# Patient Record
Sex: Male | Born: 1935 | Race: White | Hispanic: No | Smoking: Never smoker
Health system: Southern US, Community
[De-identification: ages and names within clinical notes are randomized; demographics above are authoritative.]

## PROBLEM LIST (undated history)

## (undated) DIAGNOSIS — J069 Acute upper respiratory infection, unspecified: Secondary | ICD-10-CM

## (undated) DIAGNOSIS — K469 Unspecified abdominal hernia without obstruction or gangrene: Secondary | ICD-10-CM

## (undated) DIAGNOSIS — F418 Other specified anxiety disorders: Secondary | ICD-10-CM

## (undated) DIAGNOSIS — G20 Parkinson's disease: Secondary | ICD-10-CM

## (undated) HISTORY — DX: Parkinson's disease: G20

## (undated) HISTORY — DX: Other specified anxiety disorders: F41.8

## (undated) HISTORY — DX: Unspecified abdominal hernia without obstruction or gangrene: K46.9

---

## 2003-06-13 ENCOUNTER — Ambulatory Visit (HOSPITAL_COMMUNITY): Admission: RE | Admit: 2003-06-13 | Discharge: 2003-06-13 | Payer: Self-pay | Admitting: Gastroenterology

## 2004-07-08 ENCOUNTER — Ambulatory Visit (HOSPITAL_COMMUNITY): Admission: RE | Admit: 2004-07-08 | Discharge: 2004-07-08 | Payer: Self-pay | Admitting: Gastroenterology

## 2011-11-06 ENCOUNTER — Encounter (INDEPENDENT_AMBULATORY_CARE_PROVIDER_SITE_OTHER): Payer: Self-pay | Admitting: General Surgery

## 2011-11-06 ENCOUNTER — Ambulatory Visit (INDEPENDENT_AMBULATORY_CARE_PROVIDER_SITE_OTHER): Payer: 59 | Admitting: General Surgery

## 2011-11-06 VITALS — BP 152/78 | HR 68 | Temp 98.8°F | Ht 69.0 in | Wt 191.0 lb

## 2011-11-06 DIAGNOSIS — K4090 Unilateral inguinal hernia, without obstruction or gangrene, not specified as recurrent: Secondary | ICD-10-CM

## 2011-11-06 MED ORDER — TAMSULOSIN HCL 0.4 MG PO CAPS: 0.4000 mg | ORAL_CAPSULE | Freq: Every day | ORAL | Status: DC

## 2011-11-06 NOTE — Patient Instructions (Signed)
Pick up your flomax from the pharmacy  Start taking the flomax 2 days before surgery  Hernia Repair with Laparoscope A hernia occurs when an internal organ pushes out through a weak spot in the belly (abdominal) wall muscles. Hernias most commonly occur in the groin and around the navel. Hernias can also occur through a cut by the surgeon (incision) after an abdominal operation. A hernia may be caused by:  Lifting heavy objects.   Prolonged coughing.   Straining to move your bowels.  Hernias can often be pushed back into place (reduced). Most hernias tend to get worse over time. Problems occur when abdominal contents get stuck in the opening and the blood supply is blocked or impaired (incarcerated hernia). Because of these risks, you require surgery to repair the hernia. Your hernia will be repaired using a laparoscope. Laparoscopic surgery is a type of minimally invasive surgery. It does not involve making a typical surgical cut (incision) in the skin. A laparoscope is a telescope-like rod and lens system. It is usually connected to a video camera and a light source so your caregiver can clearly see the operative area. The instruments are inserted through  to  inch (5 mm or 10 mm) openings in the skin at specific locations. A working and viewing space is created by blowing a small amount of carbon dioxide gas into the abdominal cavity. The abdomen is essentially blown up like a balloon (insufflated). This elevates the abdominal wall above the internal organs like a dome. The carbon dioxide gas is common to the human body and can be absorbed by tissue and removed by the respiratory system. Once the repair is completed, the small incisions will be closed with either stitches (sutures) or staples (just like a paper stapler only this staple holds the skin together). LET YOUR CAREGIVERS KNOW ABOUT:  Allergies.   Medications taken including herbs, eye drops, over the counter medications, and creams.     Use of steroids (by mouth or creams).   Previous problems with anesthetics or Novocaine.   Possibility of pregnancy, if this applies.   History of blood clots (thrombophlebitis).   History of bleeding or blood problems.   Previous surgery.   Other health problems.  BEFORE THE PROCEDURE  Laparoscopy can be done either in a hospital or out-patient clinic. You may be given a mild sedative to help you relax before the procedure. Once in the operating room, you will be given a general anesthesia to make you sleep (unless you and your caregiver choose a different anesthetic).  AFTER THE PROCEDURE  After the procedure you will be watched in a recovery area. Depending on what type of hernia was repaired, you might be admitted to the hospital or you might go home the same day. With this procedure you may have less pain and scarring. This usually results in a quicker recovery and less risk of infection. HOME CARE INSTRUCTIONS   Bed rest is not required. You may continue your normal activities but avoid heavy lifting (more than 10 pounds) or straining.   Cough gently. If you are a smoker it is best to stop, as even the best hernia repair can break down with the continual strain of coughing.   Avoid driving until given the OK by your surgeon.   There are no dietary restrictions unless given otherwise.   TAKE ALL MEDICATIONS AS DIRECTED.   Only take over-the-counter or prescription medicines for pain, discomfort, or fever as directed by your caregiver.  SEEK MEDICAL CARE IF:   There is increasing abdominal pain or pain in your incisions.   There is more bleeding from incisions, other than minimal spotting.   You feel light headed or faint.   You develop an unexplained fever, chills, and/or an oral temperature above 102 F (38.9 C).   You have redness, swelling, or increasing pain in the wound.   Pus coming from wound.   A foul smell coming from the wound or dressings.  SEEK  IMMEDIATE MEDICAL CARE IF:   You develop a rash.   You have difficulty breathing.   You have any allergic problems.  MAKE SURE YOU:   Understand these instructions.   Will watch your condition.   Will get help right away if you are not doing well or get worse.  Document Released: 08/31/2005 Document Revised: 05/13/2011 Document Reviewed: 07/31/2009 [facility] Patient Information 2012 [place], [place].

## 2011-11-06 NOTE — Progress Notes (Signed)
Patient ID: Randy Davenport, male   DOB: 07/27/1931, 78 y.o.   MRN: [id number]  Chief Complaint  Patient presents with  . Pre-op Exam    eval RIH    HPI Randy Davenport is a 78 y.o. male.   HPI This 78-year-old Caucasian male comes in to discuss surgery for his known right inguinal hernia. The patient initially saw Dr. Newman in 2006 for right inguinal hernia. At that time the hernia was small. For the past several years the patient has been wearing a hernia belt. The patient states that the hernia belt is becoming ineffective. He states that when he stands for prolonged period of time he has discomfort in his right groin. He describes it as a mild pain in that area. It does not radiate. He denies any inguinal numbness or tingling. He denies any fevers, chills, nausea, vomiting, diarrhea, or constipation.  He does have Parkinson's disease. Past Medical History  Diagnosis Date  . Parkinson disease   . Hernia     right ing    History reviewed. No pertinent past surgical history.  History reviewed. No pertinent family history.  Social History History  Substance Use Topics  . Smoking status: Never Smoker   . Smokeless tobacco: Not on file  . Alcohol Use: No    No Known Allergies  Current Outpatient Prescriptions  Medication Sig Dispense Refill  . aspirin 81 MG tablet Take 81 mg by mouth daily.      . pramipexole (MIRAPEX) 0.5 MG tablet Take 0.5 mg by mouth 3 (three) times daily.      . Tamsulosin HCl (FLOMAX) 0.4 MG CAPS Take 1 capsule (0.4 mg total) by mouth daily.  10 capsule  0    Review of Systems Review of Systems  Constitutional: Negative for fever, chills, appetite change and unexpected weight change.  HENT: Negative for congestion and trouble swallowing.   Eyes: Negative for visual disturbance.  Respiratory: Negative for chest tightness and shortness of breath.   Cardiovascular: Negative for chest pain and leg swelling.       No PND, no orthopnea, no DOE    Gastrointestinal:       See HPI  Genitourinary: Positive for urgency and frequency. Negative for dysuria and hematuria.  Musculoskeletal: Negative.   Skin: Negative for rash.  Neurological: Positive for tremors. Negative for dizziness, seizures, speech difficulty and headaches.  Hematological: Does not bruise/bleed easily.  Psychiatric/Behavioral: Negative for behavioral problems and confusion.    Blood pressure 152/78, pulse 68, temperature 98.8 F (37.1 C), temperature source Temporal, height 5' 9" (1.753 m), weight 191 lb (86.637 kg), SpO2 98.00%.  Physical Exam Physical Exam  Vitals reviewed. Constitutional: He is oriented to person, place, and time. He appears well-developed and well-nourished. No distress.  HENT:  Head: Normocephalic and atraumatic.  Right Ear: External ear normal.  Left Ear: External ear normal.  Eyes: Conjunctivae are normal. No scleral icterus.  Neck: Normal range of motion. Neck supple. No tracheal deviation present.  Cardiovascular: Normal rate, regular rhythm, normal heart sounds and intact distal pulses.   Pulmonary/Chest: Effort normal and breath sounds normal. No respiratory distress. He has no wheezes.  Abdominal: Soft. Bowel sounds are normal. He exhibits no distension. There is no tenderness. There is no rebound. A hernia is present. Hernia confirmed positive in the right inguinal area.  Genitourinary: Right testis shows no mass and no tenderness. Left testis shows no mass and no tenderness.       Rt   Scrotal hernia, soft, nontender. Left groin bulge with valsalva-?hernia;   Musculoskeletal: Normal range of motion. He exhibits no edema and no tenderness.  Lymphadenopathy:    He has no cervical adenopathy.  Neurological: He is alert and oriented to person, place, and time. He exhibits normal muscle tone.       Mild tremor in RUE  Skin: Skin is warm and dry. No rash noted. He is not diaphoretic. No erythema.  Psychiatric: His behavior is normal.  Thought content normal.    Data Reviewed Dr Newman's office from 2006  Assessment    Right inguinal hernia Possible left inguinal hernia    Plan    We discussed the etiology of inguinal hernias. We discussed the signs & symptoms of incarceration & strangulation.  We discussed non-operative and operative management. We discussed both open and laparoscopic repairs. Since I'm concerned he may have bilateral hernias, I have recommended a laparoscopic approach  The patient has elected to have LAPAROSCOPIC RIGHT INGUINAL HERNIA REPAIR WITH MESH, POSSIBLE BILATERAL   I described the procedure in detail.  The patient was given educational material. We discussed the risks and benefits including but not limited to bleeding, infection, chronic inguinal pain, nerve entrapment, hernia recurrence, mesh complications, hematoma formation, urinary retention, injury to the testicles, numbness in the groin, blood clots, injury to the surrounding structures, and anesthesia risk. We also discussed the typical post operative recovery course, including no heavy lifting for 4-6 weeks. I explained that the likelihood of improvement of their symptoms is good.  He'll be placed on perioperative flomax to help decrease his risk of urinary retention  Hilde Churchman M. Lashara Urey, MD, FACS General, Bariatric, & Minimally Invasive Surgery [facility]         Joyia Riehle M 11/06/2011, 11:37 AM    

## 2011-11-19 ENCOUNTER — Encounter (HOSPITAL_COMMUNITY): Payer: Self-pay | Admitting: Pharmacy Technician

## 2011-11-23 ENCOUNTER — Other Ambulatory Visit: Payer: Self-pay

## 2011-11-23 ENCOUNTER — Encounter (HOSPITAL_COMMUNITY)
Admission: RE | Admit: 2011-11-23 | Discharge: 2011-11-23 | Disposition: A | Discharge status: Home / Self Care | Payer: Medicare Other | Source: Ambulatory Visit | Attending: General Surgery | Admitting: General Surgery

## 2011-11-23 ENCOUNTER — Encounter (HOSPITAL_COMMUNITY): Payer: Self-pay

## 2011-11-23 ENCOUNTER — Ambulatory Visit (HOSPITAL_COMMUNITY)
Admission: RE | Admit: 2011-11-23 | Discharge: 2011-11-23 | Disposition: A | Discharge status: Home / Self Care | Payer: Medicare Other | Source: Ambulatory Visit | Attending: General Surgery | Admitting: General Surgery

## 2011-11-23 DIAGNOSIS — Z01818 Encounter for other preprocedural examination: Secondary | ICD-10-CM | POA: Insufficient documentation

## 2011-11-23 DIAGNOSIS — R05 Cough: Secondary | ICD-10-CM | POA: Insufficient documentation

## 2011-11-23 DIAGNOSIS — R059 Cough, unspecified: Secondary | ICD-10-CM | POA: Insufficient documentation

## 2011-11-23 DIAGNOSIS — G20A1 Parkinson's disease without dyskinesia, without mention of fluctuations: Secondary | ICD-10-CM | POA: Insufficient documentation

## 2011-11-23 DIAGNOSIS — Z01810 Encounter for preprocedural cardiovascular examination: Secondary | ICD-10-CM | POA: Insufficient documentation

## 2011-11-23 DIAGNOSIS — R0989 Other specified symptoms and signs involving the circulatory and respiratory systems: Secondary | ICD-10-CM | POA: Insufficient documentation

## 2011-11-23 DIAGNOSIS — I498 Other specified cardiac arrhythmias: Secondary | ICD-10-CM | POA: Insufficient documentation

## 2011-11-23 DIAGNOSIS — G20 Parkinson's disease: Secondary | ICD-10-CM | POA: Insufficient documentation

## 2011-11-23 DIAGNOSIS — Z01812 Encounter for preprocedural laboratory examination: Secondary | ICD-10-CM | POA: Insufficient documentation

## 2011-11-23 HISTORY — DX: Acute upper respiratory infection, unspecified: J06.9

## 2011-11-23 LAB — CBC
Hemoglobin: 14.4 g/dL (ref 13.0–17.0)
MCHC: 32.2 g/dL (ref 30.0–36.0)
RDW: 14 % (ref 11.5–15.5)
WBC: 6 10*3/uL (ref 4.0–10.5)

## 2011-11-23 LAB — BASIC METABOLIC PANEL
Chloride: 102 mEq/L (ref 96–112)
GFR calc Af Amer: 60 mL/min — ABNORMAL LOW (ref >90)
GFR calc non Af Amer: 52 mL/min — ABNORMAL LOW (ref >90)
Potassium: 4.5 mEq/L (ref 3.5–5.1)
Sodium: 139 mEq/L (ref 135–145)

## 2011-11-23 LAB — URINALYSIS, ROUTINE W REFLEX MICROSCOPIC
Bilirubin Urine: NEGATIVE
Hgb urine dipstick: NEGATIVE
Nitrite: NEGATIVE
Specific Gravity, Urine: 1.022 (ref 1.005–1.030)
Urobilinogen, UA: 0.2 mg/dL (ref 0.0–1.0)
pH: 7 (ref 5.0–8.0)

## 2011-11-23 LAB — DIFFERENTIAL
Basophils Absolute: 0.1 10*3/uL (ref 0.0–0.1)
Lymphocytes Relative: 31 % (ref 12–46)
Lymphs Abs: 1.8 10*3/uL (ref 0.7–4.0)
Neutro Abs: 3.3 10*3/uL (ref 1.7–7.7)

## 2011-11-23 LAB — SURGICAL PCR SCREEN
MRSA, PCR: NEGATIVE
Staphylococcus aureus: NEGATIVE

## 2011-11-23 MED ORDER — CEFAZOLIN SODIUM 1-5 GM-% IV SOLN: 1.0000 g | INTRAVENOUS | Status: DC

## 2011-11-23 NOTE — Patient Instructions (Signed)
20 Randy Davenport  11/23/2011   Your procedure is scheduled on:  11/26/11 0910am-1040am  Report to [facility] at 0700 AM.  Call this number if you have problems the morning of surgery: [phone]   Remember:   Do not eat food:After Midnight.  May have clear liquids:until Midnight .  Clear liquids include soda, tea, black coffee, apple or grape juice, broth.  Take these medicines the morning of surgery with A SIP OF WATER:    Do not wear jewelry,   Do not wear lotions, powders, or perfumes.     Do not bring valuables to the hospital.  Contacts, dentures or bridgework may not be worn into surgery.      Patients discharged the day of surgery will not be allowed to drive home.  Name and phone number of your driver:   Special Instructions: CHG Shower Use Special Wash: 1/2 bottle night before surgery and 1/2 bottle morning of surgery. shower chin to toes with CHG.  Wash face and private parts with regular soap.Marland Kitchen     Please read over the following fact sheets that you were given: MRSA Information, coughing and deep breathing exercises, leg exercises

## 2011-11-26 ENCOUNTER — Encounter (HOSPITAL_COMMUNITY): Payer: Self-pay | Admitting: *Deleted

## 2011-11-26 ENCOUNTER — Ambulatory Visit (HOSPITAL_COMMUNITY)
Admission: RE | Admit: 2011-11-26 | Discharge: 2011-11-26 | Disposition: A | Discharge status: Home / Self Care | Payer: Medicare Other | Source: Ambulatory Visit | Attending: General Surgery | Admitting: General Surgery

## 2011-11-26 ENCOUNTER — Encounter (HOSPITAL_COMMUNITY)
Admission: RE | Disposition: A | Discharge status: Home / Self Care | Payer: Self-pay | Source: Ambulatory Visit | Attending: General Surgery

## 2011-11-26 ENCOUNTER — Ambulatory Visit (HOSPITAL_COMMUNITY): Payer: Medicare Other | Admitting: Anesthesiology

## 2011-11-26 ENCOUNTER — Encounter (HOSPITAL_COMMUNITY): Payer: Self-pay | Admitting: Anesthesiology

## 2011-11-26 DIAGNOSIS — Z01812 Encounter for preprocedural laboratory examination: Secondary | ICD-10-CM | POA: Insufficient documentation

## 2011-11-26 DIAGNOSIS — K4090 Unilateral inguinal hernia, without obstruction or gangrene, not specified as recurrent: Secondary | ICD-10-CM | POA: Insufficient documentation

## 2011-11-26 DIAGNOSIS — G20 Parkinson's disease: Secondary | ICD-10-CM | POA: Insufficient documentation

## 2011-11-26 DIAGNOSIS — G20A1 Parkinson's disease without dyskinesia, without mention of fluctuations: Secondary | ICD-10-CM | POA: Insufficient documentation

## 2011-11-26 DIAGNOSIS — Z79899 Other long term (current) drug therapy: Secondary | ICD-10-CM | POA: Insufficient documentation

## 2011-11-26 DIAGNOSIS — Z7982 Long term (current) use of aspirin: Secondary | ICD-10-CM | POA: Insufficient documentation

## 2011-11-26 HISTORY — PX: INGUINAL HERNIA REPAIR: SHX194

## 2011-11-26 SURGERY — REPAIR, HERNIA, INGUINAL, LAPAROSCOPIC
Anesthesia: General | Site: Abdomen | Laterality: Right | Wound class: Clean

## 2011-11-26 MED ORDER — GLYCOPYRROLATE 0.2 MG/ML IJ SOLN
INTRAMUSCULAR | Status: DC | PRN
  Administered 2011-11-26: 0.1 mg via INTRAVENOUS
  Administered 2011-11-26: .7 mg via INTRAVENOUS

## 2011-11-26 MED ORDER — LACTATED RINGERS IV SOLN: INTRAVENOUS | Status: DC

## 2011-11-26 MED ORDER — NEOSTIGMINE METHYLSULFATE 1 MG/ML IJ SOLN
INTRAMUSCULAR | Status: DC | PRN
  Administered 2011-11-26: 4 mg via INTRAVENOUS

## 2011-11-26 MED ORDER — ACETAMINOPHEN 10 MG/ML IV SOLN
INTRAVENOUS | Status: DC | PRN
  Administered 2011-11-26: 1000 mg via INTRAVENOUS

## 2011-11-26 MED ORDER — ATROPINE SULFATE 0.4 MG/ML IJ SOLN
INTRAMUSCULAR | Status: DC | PRN
  Administered 2011-11-26: 0.4 mg via INTRAVENOUS

## 2011-11-26 MED ORDER — OXYCODONE-ACETAMINOPHEN 5-325 MG PO TABS: 1.0000 | ORAL_TABLET | ORAL | Status: AC | PRN

## 2011-11-26 MED ORDER — BUPIVACAINE LIPOSOME 1.3 % IJ SUSP
20.0000 mL | Freq: Once | INTRAMUSCULAR | Status: DC
  Filled 2011-11-26: qty 20

## 2011-11-26 MED ORDER — EPHEDRINE SULFATE 50 MG/ML IJ SOLN
INTRAMUSCULAR | Status: DC | PRN
  Administered 2011-11-26 (×2): 5 mg via INTRAVENOUS

## 2011-11-26 MED ORDER — LIDOCAINE HCL (CARDIAC) 20 MG/ML IV SOLN
INTRAVENOUS | Status: DC | PRN
  Administered 2011-11-26: 80 mg via INTRAVENOUS

## 2011-11-26 MED ORDER — ROCURONIUM BROMIDE 100 MG/10ML IV SOLN
INTRAVENOUS | Status: DC | PRN
  Administered 2011-11-26 (×2): 10 mg via INTRAVENOUS
  Administered 2011-11-26: 40 mg via INTRAVENOUS

## 2011-11-26 MED ORDER — BUPIVACAINE-EPINEPHRINE PF 0.25-1:200000 % IJ SOLN
INTRAMUSCULAR | Status: DC | PRN
  Administered 2011-11-26: 25 mL

## 2011-11-26 MED ORDER — OXYCODONE HCL 5 MG PO TABS
ORAL_TABLET | ORAL | Status: AC
  Filled 2011-11-26: qty 1

## 2011-11-26 MED ORDER — 0.9 % SODIUM CHLORIDE (POUR BTL) OPTIME
TOPICAL | Status: DC | PRN
  Administered 2011-11-26: 1000 mL

## 2011-11-26 MED ORDER — ACETAMINOPHEN 10 MG/ML IV SOLN
INTRAVENOUS | Status: AC
  Filled 2011-11-26: qty 100

## 2011-11-26 MED ORDER — LACTATED RINGERS IR SOLN
Status: DC | PRN
  Administered 2011-11-26: 1000 mL

## 2011-11-26 MED ORDER — CHLORHEXIDINE GLUCONATE 4 % EX LIQD: 1.0000 "application " | Freq: Once | CUTANEOUS | Status: DC

## 2011-11-26 MED ORDER — BUPIVACAINE-EPINEPHRINE PF 0.25-1:200000 % IJ SOLN
INTRAMUSCULAR | Status: AC
  Filled 2011-11-26: qty 60

## 2011-11-26 MED ORDER — FENTANYL CITRATE 0.05 MG/ML IJ SOLN
INTRAMUSCULAR | Status: DC | PRN
  Administered 2011-11-26: 150 ug via INTRAVENOUS
  Administered 2011-11-26: 50 ug via INTRAVENOUS

## 2011-11-26 MED ORDER — LACTATED RINGERS IV SOLN
INTRAVENOUS | Status: DC | PRN
  Administered 2011-11-26: 08:00:00 via INTRAVENOUS

## 2011-11-26 MED ORDER — SUCCINYLCHOLINE CHLORIDE 20 MG/ML IJ SOLN
INTRAMUSCULAR | Status: DC | PRN
  Administered 2011-11-26: 80 mg via INTRAVENOUS
  Administered 2011-11-26: 100 mg via INTRAVENOUS

## 2011-11-26 MED ORDER — PROPOFOL 10 MG/ML IV BOLUS
INTRAVENOUS | Status: DC | PRN
  Administered 2011-11-26: 80 mg via INTRAVENOUS
  Administered 2011-11-26: 120 mg via INTRAVENOUS

## 2011-11-26 MED ORDER — FENTANYL CITRATE 0.05 MG/ML IJ SOLN: 25.0000 ug | INTRAMUSCULAR | Status: DC | PRN

## 2011-11-26 MED ORDER — PROMETHAZINE HCL 25 MG/ML IJ SOLN: 6.2500 mg | INTRAMUSCULAR | Status: DC | PRN

## 2011-11-26 MED ORDER — OXYCODONE-ACETAMINOPHEN 5-325 MG PO TABS: 1.0000 | ORAL_TABLET | ORAL | Status: DC | PRN

## 2011-11-26 MED ORDER — OXYCODONE HCL 5 MG PO TABS
5.0000 mg | ORAL_TABLET | ORAL | Status: DC | PRN
  Administered 2011-11-26: 5 mg via ORAL

## 2011-11-26 MED ORDER — CEFAZOLIN SODIUM-DEXTROSE 2-3 GM-% IV SOLR
2.0000 g | Freq: Once | INTRAVENOUS | Status: AC
  Administered 2011-11-26: 2 g via INTRAVENOUS

## 2011-11-26 MED ORDER — ONDANSETRON HCL 4 MG/2ML IJ SOLN
INTRAMUSCULAR | Status: DC | PRN
  Administered 2011-11-26: 4 mg via INTRAVENOUS

## 2011-11-26 MED ORDER — DEXAMETHASONE SODIUM PHOSPHATE 10 MG/ML IJ SOLN
INTRAMUSCULAR | Status: DC | PRN
  Administered 2011-11-26: 10 mg via INTRAVENOUS

## 2011-11-26 SURGICAL SUPPLY — 42 items
3D Max Light Mesh (Mesh General) ×1 IMPLANT
ADH SKN CLS APL DERMABOND .7 (GAUZE/BANDAGES/DRESSINGS) ×2
APL SKNCLS STERI-STRIP NONHPOA (GAUZE/BANDAGES/DRESSINGS)
BANDAGE ADHESIVE 1X3 (GAUZE/BANDAGES/DRESSINGS) IMPLANT
BENZOIN TINCTURE PRP APPL 2/3 (GAUZE/BANDAGES/DRESSINGS) IMPLANT
CABLE HIGH FREQUENCY MONO STRZ (ELECTRODE) ×1 IMPLANT
CANISTER SUCTION 2500CC (MISCELLANEOUS) ×2 IMPLANT
CLOTH BEACON ORANGE TIMEOUT ST (SAFETY) ×2 IMPLANT
COVER SURGICAL LIGHT HANDLE (MISCELLANEOUS) ×1 IMPLANT
DECANTER SPIKE VIAL GLASS SM (MISCELLANEOUS) ×1 IMPLANT
DERMABOND ADVANCED (GAUZE/BANDAGES/DRESSINGS) ×2
DERMABOND ADVANCED .7 DNX12 (GAUZE/BANDAGES/DRESSINGS) IMPLANT
DEVICE SECURE STRAP 25 ABSORB (INSTRUMENTS) ×2 IMPLANT
DRAPE LAPAROSCOPIC ABDOMINAL (DRAPES) ×2 IMPLANT
DRAPE LG THREE QUARTER DISP (DRAPES) ×1 IMPLANT
DRAPE UTILITY XL STRL (DRAPES) ×2 IMPLANT
DRSG TEGADERM 2-3/8X2-3/4 SM (GAUZE/BANDAGES/DRESSINGS) IMPLANT
DRSG TEGADERM 4X4.75 (GAUZE/BANDAGES/DRESSINGS) IMPLANT
ELECT REM PT RETURN 9FT ADLT (ELECTROSURGICAL) ×2
ELECTRODE REM PT RTRN 9FT ADLT (ELECTROSURGICAL) ×1 IMPLANT
GLOVE BIO SURGEON STRL SZ7.5 (GLOVE) ×2 IMPLANT
GLOVE BIOGEL M STRL SZ7.5 (GLOVE) IMPLANT
GLOVE INDICATOR 8.0 STRL GRN (GLOVE) ×1 IMPLANT
GOWN STRL NON-REIN LRG LVL3 (GOWN DISPOSABLE) ×2 IMPLANT
GOWN STRL REIN XL XLG (GOWN DISPOSABLE) ×4 IMPLANT
KIT BASIN OR (CUSTOM PROCEDURE TRAY) ×2 IMPLANT
NS IRRIG 1000ML POUR BTL (IV SOLUTION) ×2 IMPLANT
SCISSORS LAP 5X35 DISP (ENDOMECHANICALS) ×1 IMPLANT
SET IRRIG TUBING LAPAROSCOPIC (IRRIGATION / IRRIGATOR) IMPLANT
SHEARS CURVED HARMONIC AC 45CM (MISCELLANEOUS) IMPLANT
SLEEVE SURGEON STRL (DRAPES) ×1 IMPLANT
SOLUTION ANTI FOG 6CC (MISCELLANEOUS) ×2 IMPLANT
STRIP CLOSURE SKIN 1/2X4 (GAUZE/BANDAGES/DRESSINGS) IMPLANT
SUT MNCRL AB 4-0 PS2 18 (SUTURE) ×2 IMPLANT
SUT VICRYL 0 UR6 27IN ABS (SUTURE) ×1 IMPLANT
TOWEL OR 17X26 10 PK STRL BLUE (TOWEL DISPOSABLE) ×2 IMPLANT
TOWEL OR NON WOVEN STRL DISP B (DISPOSABLE) ×1 IMPLANT
TRAY FOLEY CATH 14FRSI W/METER (CATHETERS) ×2 IMPLANT
TRAY LAP CHOLE (CUSTOM PROCEDURE TRAY) ×2 IMPLANT
TROCAR BLADELESS OPT 5 75 (ENDOMECHANICALS) ×4 IMPLANT
TROCAR XCEL BLUNT TIP 100MML (ENDOMECHANICALS) ×2 IMPLANT
TUBING INSUFFLATION 10FT LAP (TUBING) ×2 IMPLANT

## 2011-11-26 NOTE — Interval H&P Note (Signed)
History and Physical Interval Note:  11/26/2011 8:53 AM  Ernest Mallick  has presented today for surgery, with the diagnosis of right inguinal hernia, left groin bulge  The various methods of treatment have been discussed with the patient and family. After consideration of risks, benefits and other options for treatment, the patient has consented to  Procedure(s) (LRB): LAPAROSCOPIC INGUINAL HERNIA (Right) INSERTION OF MESH (Right), POSSIBLE BILATERAL REPAIR as a surgical intervention .  The patients' history has been reviewed, patient examined, no change in status, stable for surgery.  I have reviewed the patients' chart and labs.  Questions were answered to the patient's satisfaction.    Mary Sella. Andrey Campanile, MD, FACS General, Bariatric, & Minimally Invasive Surgery [facility], [place]   [facility]

## 2011-11-26 NOTE — Op Note (Signed)
11/26/2011  Randy Davenport 08-01-1931   PREOPERATIVE DIAGNOSIS: Right inguinal hernia; left inguinal bulge  POSTOPERATIVE DIAGNOSIS: large Right Direct inguinal hernia.   PROCEDURE: Laparoscopic repair of right direct inguinal hernia with  mesh (TAPP).   SURGEON: Mary Sella. Andrey Campanile, MD   ASSISTANT SURGEON: None.   ANESTHESIA: General plus local consisting of 0.25% Marcaine with epi.   ESTIMATED BLOOD LOSS: Minimal.   FINDINGS: The patient had a large right direct inguinal hernia.  It was repaired using a 4 inch x 6.2  inch piece of Bard 3DMax light mes  SPECIMEN: none  INDICATIONS FOR PROCEDURE: 78 yo WM with a long standing right inguinal hernia and left groin bulge desires surgical repair The risks and benefits including but not limited to bleeding, infection, chronic inguinal pain, nerve entrapment, hernia recurrence, mesh complications, hematoma formation, urinary retention, injury to the testicles or the ovaries, numbness in the groin, blood clots, injury to the surrounding structures, and anesthesia risk was discussed with the patient.  DESCRIPTION OF PROCEDURE: After obtaining verbal consent, the patient was then taken back to the operating room, placed  supine on the operating room table. General endotracheal anesthesia was  established. The patient had emptied their bladder prior to going back to  the operating room. Sequential compression devices were placed. The  abdomen and groin were prepped and draped in the usual standard surgical  fashion with ChloraPrep. The patient received IV Tylenol as well as IV  antibiotics prior to the incision. A surgical time-out was performed.  Local was infiltrated at the base of the umbilicus.   Next, a 1-cm vertical infraumbilical incision was made with a #11 blade. The fascia  was grasped and lifted anteriorly. Next, the fascia was incised, and  the abdominal cavity was entered. Pursestring suture was placed around  the fascial  edges using a 0 Vicryl. A 12-mm Hasson trocar was placed.  Pneumoperitoneum was smoothly established up to a patient pressure of 15  mmHg. Laparoscope was advanced. There was NO evidence of a  contralateral hernia. The patient had a defect medial to  the inferior epigastric vessel, consistent with an right  direct  hernia. Two 5-mm trocars were placed, one on the right, one on the left  in the midclavicular line slightly above the level of the umbilicus all  under direct visualization. After local had been infiltrated, I then  made incision along the peritoneum on the right, starting 2 inches above  the anterior superior iliac spine and caring it medial  toward the median umbilical ligament in a lazy S configuration using  Endo Shears with electrocautery. The peritoneal flap was then gently  dissected downward from the anterior abdominal wall taking care not to  injure the inferior epigastric vessels. The pubic bone was identified.  The testicular vessels were identified.  Using  traction and counter traction with short graspers, I reduced the direct hernia in  its entirety stripping it away from the peritoneum. The patient had a large amount of fat in his direct defect and took about 15 minutes to reduce. The testicular vessels had been identified and preserved. The vas deferens was identified and preserved. I then went about creating a large pocket by  lifting the peritoneum of the pelvic floor. I took great care not to  injure the iliac vessels.  I then obtained a piece of Bard 3D Max light mesh 4 inch x  6.2 inch, placed it through the Hasson trocar, half of it covered medial  to the inferior epigastric vessels and half of it lateral to the  inferior epigastric vessels. The defect was well  covered with the mesh. I then secured the mesh to the abdominal wall  using an Ethicon secure strap tack. 2 Tacks were placed through  the Cooper's ligament, one tack on each side of the inferior  epigastric  vessel and two tacks out laterally. No tacks were placed below the  shelving edge of the inguinal ligament. Pneumoperitoneum was reduced  to 8 mmHg. I then brought the peritoneal flap back up to the abdominal  wall and tacked it to the abdominal wall using 4 tacks. There was no  defect in the peritoneum, and the mesh was well covered. I removed the  Hasson trocar and tied down the previously placed pursestring suture.  The closure was viewed laparoscopically. There was no evidence of  fascial defect. There was no air leak at the umbilicus. There was no  evidence of injury to surrounding structures. Pneumoperitoneum was  released, and the remaining trocars were removed. All skin incisions  were closed with a 4-0 Monocryl in a subcuticular fashion followed by  application of Dermabond. All needle, instrument, and sponge counts  were correct x2. There are no immediate complications. The patient  tolerated the procedure well. The patient was extubated and taken to the  recovery room in stable condition.  Mary Sella. Andrey Campanile, MD, FACS General, Bariatric, & Minimally Invasive Surgery [facility], [place]

## 2011-11-26 NOTE — Transfer of Care (Signed)
Immediate Anesthesia Transfer of Care Note  Patient: Randy Davenport  Procedure(s) Performed: Procedure(s) (LRB): LAPAROSCOPIC INGUINAL HERNIA (Right) INSERTION OF MESH (Right)  Patient Location: PACU  Anesthesia Type: General  Level of Consciousness: sedated, patient cooperative and responds to stimulaton  Airway & Oxygen Therapy: Patient Spontanous Breathing and Patient connected to face mask oxgen  Post-op Assessment: Report given to PACU RN and Post -op Vital signs reviewed and stable  Post vital signs: Reviewed and stable  Complications: No apparent anesthesia complications

## 2011-11-26 NOTE — H&P (View-Only) (Signed)
Patient ID: Randy Davenport, male   DOB: 19-Dec-1930, 78 y.o.   MRN: [id number]  Chief Complaint  Patient presents with  . Pre-op Exam    eval RIH    HPI Randy Davenport is a 78 y.o. male.   HPI This 78 year old Caucasian male comes in to discuss surgery for his known right inguinal hernia. The patient initially saw Dr. Ezzard Standing in 2006 for right inguinal hernia. At that time the hernia was small. For the past several years the patient has been wearing a hernia belt. The patient states that the hernia belt is becoming ineffective. He states that when he stands for prolonged period of time he has discomfort in his right groin. He describes it as a mild pain in that area. It does not radiate. He denies any inguinal numbness or tingling. He denies any fevers, chills, nausea, vomiting, diarrhea, or constipation.  He does have Parkinson's disease. Past Medical History  Diagnosis Date  . Parkinson disease   . Hernia     right ing    History reviewed. No pertinent past surgical history.  History reviewed. No pertinent family history.  Social History History  Substance Use Topics  . Smoking status: Never Smoker   . Smokeless tobacco: Not on file  . Alcohol Use: No    No Known Allergies  Current Outpatient Prescriptions  Medication Sig Dispense Refill  . aspirin 81 MG tablet Take 81 mg by mouth daily.      . pramipexole (MIRAPEX) 0.5 MG tablet Take 0.5 mg by mouth 3 (three) times daily.      . Tamsulosin HCl (FLOMAX) 0.4 MG CAPS Take 1 capsule (0.4 mg total) by mouth daily.  10 capsule  0    Review of Systems Review of Systems  Constitutional: Negative for fever, chills, appetite change and unexpected weight change.  HENT: Negative for congestion and trouble swallowing.   Eyes: Negative for visual disturbance.  Respiratory: Negative for chest tightness and shortness of breath.   Cardiovascular: Negative for chest pain and leg swelling.       No PND, no orthopnea, no DOE    Gastrointestinal:       See HPI  Genitourinary: Positive for urgency and frequency. Negative for dysuria and hematuria.  Musculoskeletal: Negative.   Skin: Negative for rash.  Neurological: Positive for tremors. Negative for dizziness, seizures, speech difficulty and headaches.  Hematological: Does not bruise/bleed easily.  Psychiatric/Behavioral: Negative for behavioral problems and confusion.    Blood pressure 152/78, pulse 68, temperature 98.8 F (37.1 C), temperature source Temporal, height 5\' 9"  (1.753 m), weight 191 lb (86.637 kg), SpO2 98.00%.  Physical Exam Physical Exam  Vitals reviewed. Constitutional: He is oriented to person, place, and time. He appears well-developed and well-nourished. No distress.  HENT:  Head: Normocephalic and atraumatic.  Right Ear: External ear normal.  Left Ear: External ear normal.  Eyes: Conjunctivae are normal. No scleral icterus.  Neck: Normal range of motion. Neck supple. No tracheal deviation present.  Cardiovascular: Normal rate, regular rhythm, normal heart sounds and intact distal pulses.   Pulmonary/Chest: Effort normal and breath sounds normal. No respiratory distress. He has no wheezes.  Abdominal: Soft. Bowel sounds are normal. He exhibits no distension. There is no tenderness. There is no rebound. A hernia is present. Hernia confirmed positive in the right inguinal area.  Genitourinary: Right testis shows no mass and no tenderness. Left testis shows no mass and no tenderness.       Rt  Scrotal hernia, soft, nontender. Left groin bulge with valsalva-?hernia;   Musculoskeletal: Normal range of motion. He exhibits no edema and no tenderness.  Lymphadenopathy:    He has no cervical adenopathy.  Neurological: He is alert and oriented to person, place, and time. He exhibits normal muscle tone.       Mild tremor in RUE  Skin: Skin is warm and dry. No rash noted. He is not diaphoretic. No erythema.  Psychiatric: His behavior is normal.  Thought content normal.    Data Reviewed Dr Allene Pyo office from 2006  Assessment    Right inguinal hernia Possible left inguinal hernia    Plan    We discussed the etiology of inguinal hernias. We discussed the signs & symptoms of incarceration & strangulation.  We discussed non-operative and operative management. We discussed both open and laparoscopic repairs. Since I'm concerned he may have bilateral hernias, I have recommended a laparoscopic approach  The patient has elected to have LAPAROSCOPIC RIGHT INGUINAL HERNIA REPAIR WITH MESH, POSSIBLE BILATERAL   I described the procedure in detail.  The patient was given educational material. We discussed the risks and benefits including but not limited to bleeding, infection, chronic inguinal pain, nerve entrapment, hernia recurrence, mesh complications, hematoma formation, urinary retention, injury to the testicles, numbness in the groin, blood clots, injury to the surrounding structures, and anesthesia risk. We also discussed the typical post operative recovery course, including no heavy lifting for 4-6 weeks. I explained that the likelihood of improvement of their symptoms is good.  He'll be placed on perioperative flomax to help decrease his risk of urinary retention  Mary Sella. Andrey Campanile, MD, FACS General, Bariatric, & Minimally Invasive Surgery [facility], [place]         [facility] M 11/06/2011, 11:37 AM

## 2011-11-26 NOTE — Progress Notes (Signed)
Up multiple times in hallway to walk tolerated well. Instructed to call doctor if any problems urinating after he gets home. Reviewed all discharge instructions.

## 2011-11-26 NOTE — Discharge Instructions (Signed)
[facility]  UMBILICAL OR INGUINAL HERNIA REPAIR: POST OP INSTRUCTIONS  Always review your discharge instruction sheet given to you by the facility where your surgery was performed. IF YOU HAVE DISABILITY OR FAMILY LEAVE FORMS, YOU MUST BRING THEM TO THE OFFICE FOR PROCESSING.   DO NOT GIVE THEM TO YOUR DOCTOR.  1. A  prescription for pain medication may be given to you upon discharge.  Take your pain medication as prescribed, if needed.  If narcotic pain medicine is not needed, then you may take acetaminophen (Tylenol) or ibuprofen (Advil) as needed. 2. Take your usually prescribed medications unless otherwise directed. 3. If you need a refill on your pain medication, please contact your pharmacy.  They will contact our office to request authorization. Prescriptions will not be filled after 5 pm or on week-ends. 4. You should follow a light diet the first 24 hours after arrival home, such as soup and crackers, etc.  Be sure to include lots of fluids daily.  Resume your normal diet the day after surgery. 5. Most patients will experience some swelling and bruising around the umbilicus or in the groin and scrotum.  Ice packs and reclining will help.  Swelling and bruising can take several days to resolve.  6. It is common to experience some constipation if taking pain medication after surgery.  Increasing fluid intake and taking a stool softener (such as Colace) will usually help or prevent this problem from occurring.  A mild laxative (Milk of Magnesia or Miralax) should be taken according to package directions if there are no bowel movements after 48 hours. 7.  If your surgeon used skin glue on the incision, you may shower in 24 hours.  The glue will flake off over the next 2-3 weeks.  Any sutures or staples will be removed at the office during your follow-up visit. 8. ACTIVITIES:  You may resume regular (light) daily activities beginning the next day--such as daily self-care,  walking, climbing stairs--gradually increasing activities as tolerated.  You may have sexual intercourse when it is comfortable.  Refrain from any heavy lifting or straining until approved by your doctor. a. You may drive when you are no longer taking prescription pain medication, you can comfortably wear a seatbelt, and you can safely maneuver your car and apply brakes. 9. You should see your doctor in the office for a follow-up appointment approximately 2-3 weeks after your surgery.  Make sure that you call for this appointment within a day or two after you arrive home to insure a convenient appointment time. 10. OTHER INSTRUCTIONS: Wearing a jock strap or tight briefs will help with scrotal discomfort.    WHEN TO CALL YOUR DOCTOR: 1. Fever over 101.0 2. Inability to urinate 3. Nausea and/or vomiting 4. Extreme swelling or bruising 5. Continued bleeding from incision. 6. Increased pain, redness, or drainage from the incision  The clinic staff is available to answer your questions during regular business hours.  Please don't hesitate to call and ask to speak to one of the nurses for clinical concerns.  If you have a medical emergency, go to the nearest emergency room or call 911.  A surgeon from [facility] is always on call at the hospital   [address], [address], [place], [place]  [address] ?  [address] [address], [place], [place]   [address] [phone] ? [phone] ? FAX [phone] Web site: [email or web address]

## 2011-11-26 NOTE — Anesthesia Postprocedure Evaluation (Signed)
  Anesthesia Post-op Note  Patient: NABIL BUBOLZ  Procedure(s) Performed: Procedure(s) (LRB): LAPAROSCOPIC INGUINAL HERNIA (Right) INSERTION OF MESH (Right)  Patient Location: PACU  Anesthesia Type: General  Level of Consciousness: awake and alert   Airway and Oxygen Therapy: Patient Spontanous Breathing  Post-op Pain: mild  Post-op Assessment: Post-op Vital signs reviewed, Patient's Cardiovascular Status Stable, Respiratory Function Stable, Patent Airway and No signs of Nausea or vomiting  Post-op Vital Signs: stable  Complications: No apparent anesthesia complications

## 2011-11-26 NOTE — Progress Notes (Signed)
Ambulatory in hallway and to BR tolerated activity well. Unable to void.

## 2011-11-26 NOTE — Anesthesia Preprocedure Evaluation (Signed)
Anesthesia Evaluation  Patient identified by MRN, date of birth, ID band Patient awake    Reviewed: Allergy & Precautions, H&P , NPO status , Patient's Chart, lab work & pertinent test results  Airway Mallampati: II TM Distance: >3 FB Neck ROM: Full    Dental No notable dental hx.    Pulmonary neg pulmonary ROS,  breath sounds clear to auscultation  Pulmonary exam normal       Cardiovascular negative cardio ROS  Rhythm:Regular Rate:Normal     Neuro/Psych parkinsons dz negative neurological ROS  negative psych ROS   GI/Hepatic negative GI ROS, Neg liver ROS,   Endo/Other  negative endocrine ROS  Renal/GU negative Renal ROS  negative genitourinary   Musculoskeletal negative musculoskeletal ROS (+)   Abdominal   Peds negative pediatric ROS (+)  Hematology negative hematology ROS (+)   Anesthesia Other Findings   Reproductive/Obstetrics negative OB ROS                           Anesthesia Physical Anesthesia Plan  ASA: II  Anesthesia Plan: General   Post-op Pain Management:    Induction: Intravenous  Airway Management Planned: Oral ETT  Additional Equipment:   Intra-op Plan:   Post-operative Plan: Extubation in OR  Informed Consent: I have reviewed the patients History and Physical, chart, labs and discussed the procedure including the risks, benefits and alternatives for the proposed anesthesia with the patient or authorized representative who has indicated his/her understanding and acceptance.   Dental advisory given  Plan Discussed with: CRNA  Anesthesia Plan Comments:         Anesthesia Quick Evaluation

## 2011-12-09 ENCOUNTER — Encounter (HOSPITAL_COMMUNITY): Payer: Self-pay | Admitting: General Surgery

## 2011-12-15 ENCOUNTER — Encounter (INDEPENDENT_AMBULATORY_CARE_PROVIDER_SITE_OTHER): Payer: Self-pay | Admitting: General Surgery

## 2011-12-15 ENCOUNTER — Ambulatory Visit (INDEPENDENT_AMBULATORY_CARE_PROVIDER_SITE_OTHER): Payer: Medicare Other | Admitting: General Surgery

## 2011-12-15 VITALS — BP 130/70 | HR 76 | Resp 16 | Ht 69.0 in | Wt 187.0 lb

## 2011-12-15 DIAGNOSIS — Z09 Encounter for follow-up examination after completed treatment for conditions other than malignant neoplasm: Secondary | ICD-10-CM

## 2011-12-15 NOTE — Progress Notes (Signed)
Chief complaint: Postop  Procedure: Status post laparoscopic repair of right direct inguinal hernia with mesh March 14  History of Present Ilness: 78 year old Caucasian male comes in today for his postop appointment. He has no complaints. He states that he did quite well after surgery. He did not have to take any narcotics. He denies any fevers or chills. He denies any nausea, vomiting, diarrhea, or constipation. He denies any numbness or tingling in his groin. He denies any difficulty urinating. He is still having some sinus issues.  Physical Exam: BP 130/70  Pulse 76  Resp 16  Ht 5\' 9"  (1.753 m)  Wt 187 lb (84.823 kg)  BMI 27.62 kg/m2  Gen: alert, NAD, non-toxic appearing Pulm: Lungs clear to auscultation, symmetric chest rise CV: regular rate and rhythm Abd: soft, nontender, nondistended. Well-healed trocar sites. No cellulitis. No incisional hernia GU: both testicles descended, no scrotal masses, no sign of hernia recurrence Ext: no edema  Pathology: Not applicable  Assessment and Plan: Status post laparoscopic repair of large right direct hernia with mesh-doing well.  I am very pleased with his recovery. He appears to be healing quite well. I reminded him that he should avoid strenuous activity until the week of April 15.  Followup p.r.n  Mary Sella. Andrey Campanile, MD, FACS General, Bariatric, & Minimally Invasive Surgery [facility] [place] .

## 2011-12-15 NOTE — Patient Instructions (Signed)
You should avoid lifting, pushing, or pulling anything greater than 15 pounds until the week of April 15th

## 2013-03-08 ENCOUNTER — Telehealth: Payer: Self-pay | Admitting: Neurology

## 2013-06-02 ENCOUNTER — Telehealth: Payer: Self-pay | Admitting: Neurology

## 2013-06-02 NOTE — Telephone Encounter (Signed)
Reassigned to Dr. Hosie Poisson for PD.

## 2013-06-13 ENCOUNTER — Encounter: Payer: Self-pay | Admitting: Neurology

## 2013-06-14 ENCOUNTER — Encounter: Payer: Self-pay | Admitting: Neurology

## 2013-06-14 ENCOUNTER — Ambulatory Visit (INDEPENDENT_AMBULATORY_CARE_PROVIDER_SITE_OTHER): Payer: Medicare Other | Admitting: Neurology

## 2013-06-14 VITALS — BP 120/68 | HR 67 | Ht 68.0 in | Wt 186.0 lb

## 2013-06-14 DIAGNOSIS — G20 Parkinson's disease: Secondary | ICD-10-CM

## 2013-06-14 MED ORDER — CARBIDOPA-LEVODOPA 25-100 MG PO TABS
ORAL_TABLET | ORAL | Status: DC
Start: 2013-06-14 — End: 2013-12-27

## 2013-06-14 NOTE — Patient Instructions (Addendum)
Overall you are doing fairly well but I do want to suggest a few things today:   As far as your medications are concerned, I would like to suggest -starting Sinemet 25-100 with the following schedule: 1)Take 1/2 tablet three times a day at 8am, noon, 4pm. Do this for 7 days. If you feel good then stay on this dose. If you don't notice much improvement then increase to 1 tablet three times a day 2)continue to take your Mirapex 0.5mg  three times a day  Please take the Sinemet 30-31minutes before or after you eat, especially if you are having a high protein meal  I would like to see you back in 4 to 6 months, sooner if we need to. Please call us with any interim questions, concerns, problems, updates or refill requests.   My clinical assistant and will answer any of your questions and relay your messages to me and also relay most of my messages to you.   Our phone number is [phone]. We also have an after hours call service for urgent matters and there is a physician on-call for urgent questions. For any emergencies you know to call 911 or go to the nearest emergency room

## 2013-06-14 NOTE — Progress Notes (Signed)
Provider:  Dr Randy Davenport Referring Provider: Arlan Davenport., MD Primary Care Physician:  Randy Davenport., MD  CC: Parkinson's disease  HPI:  Randy Davenport is a 79 y.o. male here as a follow up of his Parkinson's disease.  Last visit was around 6 months ago and, he reports he is doing well overall since then. He is currently taking Mirapex 0.5 mg 3 times a day. Patient and son both feel he is slightly undermedicated and his symptoms are progressing. They note increased tremor, pre-dominantly a rest tremor, generalized bradykinesia, slow main and softening of his voice with some dysarthria. He notes a hard time doing things with his hands. No gait instability no falls. No difficulty swallowing no sialorrhea. He is tolerating the Mirapex well, no impulse control disorder, no leg edema, no fatigue no hallucinations. Denies any constipation. Sleeping well no REM behavior disorder.  No other acute concerns at this time  Concerns/Questions:Review of Systems: Out of a complete 14 system review, the patient complains of only the following symptoms, and all other reviewed systems are negative. Positive tremor fatigue  History   Social History  . Marital Status: Widowed    Spouse Name: Randy Davenport     Number of Children: 3  . Years of Education: highschool   Occupational History  . Not on file.   Social History Main Topics  . Smoking status: Never Smoker   . Smokeless tobacco: Never Used  . Alcohol Use: No  . Drug Use: No  . Sexual Activity:    Other Topics Concern  . Not on file   Social History Narrative   Patient lives at home with wife Randy Davenport.    Patient is retired from [employer].   Patient has 3 grown children.           Family History  Problem Relation Age of Onset  . Parkinson's disease Mother 34  . Cancer Father   . Stroke Brother     Left Brain     Past Medical History  Diagnosis Date  . Parkinson disease   . Hernia     right ing  . Recurrent upper  respiratory infection (URI)     sinus infection week of 11/16/11   . Depression with anxiety     Past Surgical History  Procedure Laterality Date  . Inguinal hernia repair  11/26/2011    Procedure: LAPAROSCOPIC INGUINAL HERNIA;  Surgeon: Randy Ina, MD,FACS;  Location: [facility];  Service: General;  Laterality: Right;  Laparoscopic Right Inguinal Hernia Repair with Mesh    Current Outpatient Prescriptions  Medication Sig Dispense Refill  . citalopram (CELEXA) 10 MG tablet Take 10 mg by mouth daily.      . pramipexole (MIRAPEX) 0.5 MG tablet Take 0.5 mg by mouth 3 (three) times daily.       No current facility-administered medications for this visit.    Allergies as of 06/14/2013  . (No Known Allergies)    Vitals: BP 120/68  Pulse 67  Ht 5\' 8"  (1.727 m)  Wt 186 lb (84.369 kg)  BMI 28.29 kg/m2 Last Weight:  Wt Readings from Last 1 Encounters:  06/14/13 186 lb (84.369 kg)   Last Height:   Ht Readings from Last 1 Encounters:  06/14/13 5\' 8"  (1.727 m)     Physical exam: Exam: Gen: NAD, conversant Eyes: anicteric sclerae, moist conjunctivae HENT: Atraumati Lungs: CTA, no wheezing, rales, rhonic  CV: RRR, no MRG Abdomen: Soft, non-tender;  Extremities: No peripheral edema  Skin: Normal temperature, no rash,  Psych: Appropriate affect, pleasant  Neuro: MS: AA&Ox3, appropriately interactive, normal affect   Speech: fluent w/o paraphasic error  Memory: good recent and remote recall  CN: PERRL, EOMI no nystagmus, no ptosis, sensation intact to LT V1-V3 bilat, face symmetric, no weakness, hearing grossly intact, palate elevates symmetrically, shoulder shrug 5/5 bilat,  tongue protrudes midline, no fasiculations noted.  Motor: normal bulk and tone Strength: 5/5  In all extremities  Reflexes: symmetrical, bilat downgoing toes  Sens: LT intact in all extremities  Brief Motor UPDRS  Speech: hypophonia  Facial Expression:mild masked  facies  Tremor: Rest R1 L1 Action/postural R0 L0 Rigidity: Mild cogwheeling in extremities Finger taps:   R:1 L:2  Open/close hands: R:1 L:1  Foot taps: R:1 L:1  Arising from Chair: 1 Gait/FOG: Decreased arm swing on R with re-emergent tremor, slow slightly wide based gait. Negative pull test    Assessment:  After physical and neurologic examination, review of laboratory studies, imaging, neurophysiology testing and pre-existing records, assessment will be reviewed on the problem list.  Plan:  Treatment plan and additional workup will be reviewed under Problem List.  Mr Randy Davenport is a pleasant 23 old gentleman with a diagnosis of Parkinson's disease. He returns for followup visit with concern that his medicine is not working as well. He is currently on Mirapex 0.5 mg 3 times a day, is tolerating this well no adverse effects. Patient does appear to be slightly undermedicated on exam. Based on his feet she will hold off on increasing dopamine agonist at this time. Instead will start patient on low-dose Sinemet. Will start one half tablet 3 times a day, if patient gets good benefit from this we'll not increase. If patient does not notice a robust response will increase Sinemet to one tablet 3 times a day. Patient to followup in 5 months.  1)Parkinsons disease  -continue Mirapex 0.5mg  TID. Tolerating well, could increase in the future if needed -start Sinemet 25-100 1/2 tablets TID, can titrate up in the future -consider addition of Azilect or Selegeline in the future -follow up in 5 months

## 2013-11-13 ENCOUNTER — Telehealth: Payer: Self-pay | Admitting: Neurology

## 2013-11-13 ENCOUNTER — Ambulatory Visit: Payer: 59 | Admitting: Neurology

## 2013-11-13 NOTE — Telephone Encounter (Signed)
No show for follow up appointment on 11/13/2013

## 2013-12-27 ENCOUNTER — Encounter (INDEPENDENT_AMBULATORY_CARE_PROVIDER_SITE_OTHER): Payer: Self-pay

## 2013-12-27 ENCOUNTER — Telehealth: Payer: Self-pay | Admitting: Neurology

## 2013-12-27 ENCOUNTER — Ambulatory Visit (INDEPENDENT_AMBULATORY_CARE_PROVIDER_SITE_OTHER): Payer: Medicare Other | Admitting: Neurology

## 2013-12-27 ENCOUNTER — Encounter: Payer: Self-pay | Admitting: Neurology

## 2013-12-27 VITALS — BP 130/76 | HR 75 | Ht 68.0 in | Wt 185.0 lb

## 2013-12-27 DIAGNOSIS — G20 Parkinson's disease: Secondary | ICD-10-CM

## 2013-12-27 MED ORDER — CARBIDOPA-LEVODOPA 25-100 MG PO TABS: ORAL_TABLET | ORAL | Status: DC

## 2013-12-27 MED ORDER — DIAZEPAM 5 MG PO TABS: 5.0000 mg | ORAL_TABLET | Freq: Two times a day (BID) | ORAL | Status: DC | PRN

## 2013-12-27 NOTE — Patient Instructions (Addendum)
Overall you are doing fairly well but I do want to suggest a few things today:   Remember to drink plenty of fluid, eat healthy meals and do not skip any meals. Try to eat protein with a every meal and eat a healthy snack such as fruit or nuts in between meals. Try to keep a regular sleep-wake schedule and try to exercise daily, particularly in the form of walking, 20-30 minutes a day, if you can.   As far as your medications are concerned, I would like to suggest the following: 1)Please start taking Sinemet 25-100 three times a day at 8am, noon and 4pm. For the first week take 1/2 tablet three times a day and then increase to 1 whole tablet three times a day  I would like to see you back in 3 months, sooner if we need to. Please call us with any interim questions, concerns, problems, updates or refill requests.   My clinical assistant and will answer any of your questions and relay your messages to me and also relay most of my messages to you.   Our phone number is [phone]. We also have an after hours call service for urgent matters and there is a physician on-call for urgent questions. For any emergencies you know to call 911 or go to the nearest emergency room

## 2013-12-27 NOTE — Telephone Encounter (Signed)
Patient's son is calling to see if patient can get appt today--having real bad anxiety attacks--please call.

## 2013-12-27 NOTE — Telephone Encounter (Signed)
Patient has been scheduled for sooner appt. Today at 2:30,confirmed with patient's father

## 2013-12-27 NOTE — Progress Notes (Signed)
Provider:  Dr Hosie PoissonSumner Referring Provider: Arlan OrganManning, James S., MD Primary Care Physician:  Arlan OrganMANNING, JAMES S., MD  CC: Parkinson's disease  HPI:  Ernest MallickDavid A Cauthorn is a 80 y.o. male here as a follow up of his Parkinson's disease. Family notices that he has been more disoriented and agitated lately. They have noted an increase in his tremor, appears to have some restlessness and agitation. After further discussion with the patient it was discovered that he abruptly stopped his Mirapex last month without notifying any of his physicians. He also did not start taking the Sinemet as instructed at his last visit. No other acute concerns at this visit.   Prior visit 06/2014: Last visit was around 6 months ago and, he reports he is doing well overall since then. He is currently taking Mirapex 0.5 mg 3 times a day. Patient and son both feel he is slightly undermedicated and his symptoms are progressing. They note increased tremor, pre-dominantly a rest tremor, generalized bradykinesia, slow main and softening of his voice with some dysarthria. He notes a hard time doing things with his hands. No gait instability no falls. No difficulty swallowing no sialorrhea. He is tolerating the Mirapex well, no impulse control disorder, no leg edema, no fatigue no hallucinations. Denies any constipation. Sleeping well no REM behavior disorder.  No other acute concerns at this time  Concerns/Questions:Review of Systems: Out of a complete 14 system review, the patient complains of only the following symptoms, and all other reviewed systems are negative. Positive tremor fatigue, agitation  History   Social History  . Marital Status: Widowed    Spouse Name: Harriett Sineancy     Number of Children: 3  . Years of Education: highschool   Occupational History  . Not on file.   Social History Main Topics  . Smoking status: Never Smoker   . Smokeless tobacco: Never Used  . Alcohol Use: No  . Drug Use: No  . Sexual Activity: Not on  file   Other Topics Concern  . Not on file   Social History Narrative   Patient lives at home with wife Harriett Sineancy.    Patient is retired from [employer].   Patient has 3 grown children.           Family History  Problem Relation Age of Onset  . Parkinson's disease Mother [id number]  . Cancer Father   . Stroke Brother     Left Brain     Past Medical History  Diagnosis Date  . Parkinson disease   . Hernia     right ing  . Recurrent upper respiratory infection (URI)     sinus infection week of 11/16/11   . Depression with anxiety     Past Surgical History  Procedure Laterality Date  . Inguinal hernia repair  11/26/2011    Procedure: LAPAROSCOPIC INGUINAL HERNIA;  Surgeon: Atilano InaEric M Wilson, MD,FACS;  Location: [facility];  Service: General;  Laterality: Right;  Laparoscopic Right Inguinal Hernia Repair with Mesh    Current Outpatient Prescriptions  Medication Sig Dispense Refill  . citalopram (CELEXA) 10 MG tablet Take 10 mg by mouth daily.      Marland Kitchen. omeprazole (PRILOSEC) 20 MG capsule        No current facility-administered medications for this visit.    Allergies as of 12/27/2013  . (No Known Allergies)    Vitals: BP 130/76  Pulse 75  Ht 5\' 8"  (1.727 m)  Wt 185 lb (83.915 kg)  BMI 28.14  kg/m2 Last Weight:  Wt Readings from Last 1 Encounters:  12/27/13 185 lb (83.915 kg)   Last Height:   Ht Readings from Last 1 Encounters:  12/27/13 5\' 8"  (1.727 m)     Physical exam: Exam: Gen: NAD, conversant Eyes: anicteric sclerae, moist conjunctivae HENT: Atraumati Lungs: CTA, no wheezing, rales, rhonic                          CV: RRR, no MRG Abdomen: Soft, non-tender;  Extremities: No peripheral edema  Skin: Normal temperature, no rash,  Psych: Appropriate affect, pleasant  Neuro: MS: AA&Ox3, appropriately interactive, normal affect   Speech: fluent w/o paraphasic error  Memory: good recent and remote recall  CN: PERRL, EOMI no nystagmus, no ptosis, sensation  intact to LT V1-V3 bilat, face symmetric, no weakness, hearing grossly intact, palate elevates symmetrically, shoulder shrug 5/5 bilat,  tongue protrudes midline, no fasiculations noted.  Motor: normal bulk and tone Strength: 5/5  In all extremities  Reflexes: symmetrical, bilat downgoing toes  Sens: LT intact in all extremities  Brief Motor UPDRS  Speech: hypophonia  Facial Expression:mild masked facies  Tremor: Rest R2 L1 Action/postural R1 L0 Rigidity: Mild cogwheeling in extremities Finger taps:   R:2 L:2  Open/close hands: R:2 L:1  Foot taps: R:1 L:1  Arising from Chair: 1 Gait/FOG: Decreased arm swing on R with re-emergent tremor, slow slightly wide based gait. Negative pull test    Assessment:  After physical and neurologic examination, review of laboratory studies, imaging, neurophysiology testing and pre-existing records, assessment will be reviewed on the problem list.  Plan:  Treatment plan and additional workup will be reviewed under Problem List.  [name] is a pleasant [age] old gentleman with a diagnosis of Parkinson's disease. He returns for followup visit with concern of overall decline and increased agitation/anxiety. This worsening is likely related to him abruptly stopping his PD medication. Counseled patient on the importance of not stopping his PD medications cold [place] as this can be very dangerous. He and his family expressed understanding. Will start Sinemet 25-100 TID and can titrate further as tolerated. Would also consider restarting Mirapex if further medication required.   1)Parkinsons disease  -Start Sinemet 25-100 one tablet TID, can titrate up further in the future -consider addition of Azilect or Selegeline or restarting DA in the future -patient and family counseled on importance of taking medication on a regular basis on a set schedule -follow up in 4 months  A total of 45minutes was spent in with this patient. Over half  this time was spent on counseling patient on the diagnosis and different therapeutic options available. We extensively discussed different treatment options and the importance of good compliance.

## 2014-04-27 ENCOUNTER — Ambulatory Visit: Payer: Medicare Other | Admitting: Neurology

## 2014-05-03 ENCOUNTER — Ambulatory Visit (INDEPENDENT_AMBULATORY_CARE_PROVIDER_SITE_OTHER): Payer: Medicare Other | Admitting: Neurology

## 2014-05-03 ENCOUNTER — Encounter: Payer: Self-pay | Admitting: Neurology

## 2014-05-03 VITALS — BP 107/59 | HR 48 | Ht 68.0 in | Wt 179.2 lb

## 2014-05-03 DIAGNOSIS — F09 Unspecified mental disorder due to known physiological condition: Secondary | ICD-10-CM

## 2014-05-03 DIAGNOSIS — R4189 Other symptoms and signs involving cognitive functions and awareness: Secondary | ICD-10-CM

## 2014-05-03 NOTE — Patient Instructions (Signed)
Overall you are doing fairly well but I do want to suggest a few things today:   Remember to drink plenty of fluid, eat healthy meals and do not skip any meals. Try to eat protein with a every meal and eat a healthy snack such as fruit or nuts in between meals. Try to keep a regular sleep-wake schedule and try to exercise daily, particularly in the form of walking, 20-30 minutes a day, if you can.   As far as your medications are concerned, I would like to suggest you continue on SInemet 25-100 three times a day at 9am, 1pm and 5pm  Please have some blood work completed today  I would like to see you back in 4 months with Darrol Angelarolyn Martin, sooner if we need to. Please call us with any interim questions, concerns, problems, updates or refill requests.   My clinical assistant and will answer any of your questions and relay your messages to me and also relay most of my messages to you.   Our phone number is [phone]. We also have an after hours call service for urgent matters and there is a physician on-call for urgent questions. For any emergencies you know to call 911 or go to the nearest emergency room

## 2014-05-03 NOTE — Progress Notes (Signed)
Provider:  Dr Hosie Poisson Referring Provider: Arlan Organ., MD Primary Care Physician:  Arlan Organ., MD  CC: Parkinson's disease  HPI:  Randy Davenport is a 80 y.o. male here as a follow up of his Parkinson's disease. Overall doing well since last visit. He continues to take Sinemet 25-100 TID at 9am, 1 and 5pm. He notes good benefit from this medication. Good compliance, denies missing any doses. Concern if he is taking the correct medications though as daughter notes he has multiple different medications in the same pill bottle. No motor wearing off noted. Family notes some increased tremor of his hands. Main concern is difficulty with short term memory. Trouble recalling what he is supposed to be doing, forgetting conversations.   Prior visit 06/2014: Last visit was around 6 months ago and, he reports he is doing well overall since then. He is currently taking Mirapex 0.5 mg 3 times a day. Patient and son both feel he is slightly undermedicated and his symptoms are progressing. They note increased tremor, pre-dominantly a rest tremor, generalized bradykinesia, slow main and softening of his voice with some dysarthria. He notes a hard time doing things with his hands. No gait instability no falls. No difficulty swallowing no sialorrhea. He is tolerating the Mirapex well, no impulse control disorder, no leg edema, no fatigue no hallucinations. Denies any constipation. Sleeping well no REM behavior disorder.  No other acute concerns at this time  Concerns/Questions:Review of Systems: Out of a complete 14 system review, the patient complains of only the following symptoms, and all other reviewed systems are negative. Positive tremor fatigue, agitation  History   Social History  . Marital Status: Widowed    Spouse Name: [name]     Number of Children: 3  . Years of Education: highschool   Occupational History  . Not on file.   Social History Main Topics  . Smoking status: Never Smoker    . Smokeless tobacco: Never Used  . Alcohol Use: No  . Drug Use: No  . Sexual Activity: Not on file   Other Topics Concern  . Not on file   Social History Narrative   Patient lives at home with wife [name].    Patient is retired from [employer].   Patient has 3 grown children.           Family History  Problem Relation Age of Onset  . Parkinson's disease Mother 69  . Cancer Father   . Stroke Brother     Left Brain     Past Medical History  Diagnosis Date  . Parkinson disease   . Hernia     right ing  . Recurrent upper respiratory infection (URI)     sinus infection week of 11/16/11   . Depression with anxiety     Past Surgical History  Procedure Laterality Date  . Inguinal hernia repair  11/26/2011    Procedure: LAPAROSCOPIC INGUINAL HERNIA;  Surgeon: Atilano Ina, MD,FACS;  Location: [facility];  Service: General;  Laterality: Right;  Laparoscopic Right Inguinal Hernia Repair with Mesh    Current Outpatient Prescriptions  Medication Sig Dispense Refill  . carbidopa-levodopa (SINEMET IR) 25-100 MG per tablet 1 tablet TID at 9am, 1pm and 5pm  90 tablet  6  . citalopram (CELEXA) 10 MG tablet Take 10 mg by mouth daily.      . diazepam (VALIUM) 5 MG tablet Take 1 tablet (5 mg total) by mouth every 12 (twelve) hours as  needed for anxiety.  15 tablet  0  . omeprazole (PRILOSEC) 20 MG capsule        No current facility-administered medications for this visit.    Allergies as of 05/03/2014  . (No Known Allergies)    Vitals: BP 107/59  Pulse 48  Ht 5\' 8"  (1.727 m)  Wt 179 lb 3.2 oz (81.285 kg)  BMI 27.25 kg/m2 Last Weight:  Wt Readings from Last 1 Encounters:  05/03/14 179 lb 3.2 oz (81.285 kg)   Last Height:   Ht Readings from Last 1 Encounters:  05/03/14 5\' 8"  (1.727 m)     Physical exam: Exam: Gen: NAD, conversant Eyes: anicteric sclerae, moist conjunctivae HENT: [place] Lungs: CTA, no wheezing, rales, rhonic                          CV: RRR, no  MRG Abdomen: Soft, non-tender;  Extremities: No peripheral edema  Skin: Normal temperature, no rash,  Psych: Appropriate affect, pleasant  Neuro: MS: AA&Ox3, appropriately interactive, normal affect   Speech: fluent w/o paraphasic error  Memory: good recent and remote recall  CN: PERRL, EOMI no nystagmus, no ptosis, sensation intact to LT V1-V3 bilat, face symmetric, no weakness, hearing grossly intact, palate elevates symmetrically, shoulder shrug 5/5 bilat,  tongue protrudes midline, no fasiculations noted.  Motor: normal bulk and tone Strength: 5/5  In all extremities  Reflexes: symmetrical, bilat downgoing toes  Sens: LT intact in all extremities  Brief Motor UPDRS  Speech: hypophonia  Facial Expression:mild masked facies  Tremor: Rest R1 L0 Action/postural R1 L0 Rigidity: Mild cogwheeling in extremities Finger taps:   R:2 L:1  Open/close hands: R:1 L:1  Foot taps: R:1 L:1  Arising from Chair: 1 Gait/FOG: Decreased arm swing on R with re-emergent tremor, slow slightly wide based gait. Negative pull test    Assessment:  After physical and neurologic examination, review of laboratory studies, imaging, neurophysiology testing and pre-existing records, assessment will be reviewed on the problem list.  Plan:  Treatment plan and additional workup will be reviewed under Problem List.  1)Parkinson's disease 2)Cognitive decline  Randy Davenport is a pleasant [age] old gentleman with a diagnosis of Parkinson's disease. He returns for followup visit with main concern of cognitive difficulty. He reports good compliance with his medication and his physical exam appears to be markedly improved compared to prior visit. He has had a robust response to L-dopa. Will not make any medication changes at this time. In the future can titrate Sinemet up or add Azilect. Will check B12, MMA and TSH for cognitive decline. If unremarkable will consider starting aricept 5mg  nightly.  Follow up in 3 to 4 months with Darrol Angelarolyn Martin

## 2014-05-07 LAB — TSH: TSH: 2.48 u[IU]/mL (ref 0.450–4.500)

## 2014-05-07 LAB — VITAMIN B12: Vitamin B-12: 536 pg/mL (ref 211–946)

## 2014-05-07 LAB — METHYLMALONIC ACID, SERUM: Methylmalonic Acid: 202 nmol/L (ref 0–378)

## 2014-05-28 ENCOUNTER — Telehealth: Payer: Self-pay | Admitting: Neurology

## 2014-05-28 MED ORDER — DONEPEZIL HCL 5 MG PO TABS: 5.0000 mg | ORAL_TABLET | Freq: Every day | ORAL | Status: DC

## 2014-05-28 NOTE — Telephone Encounter (Signed)
Daughter in law questioning if there's a medication that patient can take memory?  Since blood work came back negative for Vitamin D deficiency.  Please call anytime and may leave detailed message on vm.

## 2014-05-28 NOTE — Telephone Encounter (Signed)
Yes, Aricept 5 would be good. Thanks.

## 2014-05-28 NOTE — Telephone Encounter (Signed)
Rx has been sent.  I called back.  Spoke with Randy Davenport.  She is aware.

## 2014-05-28 NOTE — Telephone Encounter (Signed)
Daughter would like to know if they can proceed with getting Rx for memory.   Last OV note says: If unremarkable will consider starting aricept  nightly. Okay to send Aricept 5?  Please advise.  Thank you.

## 2014-06-26 ENCOUNTER — Telehealth: Payer: Self-pay | Admitting: *Deleted

## 2014-06-26 DIAGNOSIS — R413 Other amnesia: Secondary | ICD-10-CM

## 2014-06-26 NOTE — Telephone Encounter (Signed)
Spoke with patient r/s appointment, patient was r/s to 09/03/14 at 11 am with NP MM, while speaking with patient's wife, they requested an increase in patient's Aricept, please advise

## 2014-06-27 MED ORDER — DONEPEZIL HCL 10 MG PO TABS: 10.0000 mg | ORAL_TABLET | Freq: Every day | ORAL | Status: DC

## 2014-06-27 NOTE — Telephone Encounter (Signed)
Aricept increased to 10 mg once daily. Pls advise wife.

## 2014-06-28 NOTE — Telephone Encounter (Signed)
Spoke with patient daughter-in-law Arline AspCindy, informed her that medication has increased and was called into patient's pharmacy.

## 2014-09-03 ENCOUNTER — Encounter: Payer: Self-pay | Admitting: Adult Health

## 2014-09-03 ENCOUNTER — Ambulatory Visit (INDEPENDENT_AMBULATORY_CARE_PROVIDER_SITE_OTHER): Payer: Medicare Other | Admitting: Adult Health

## 2014-09-03 VITALS — BP 125/72 | HR 55 | Ht 68.0 in | Wt 173.8 lb

## 2014-09-03 DIAGNOSIS — G20 Parkinson's disease: Secondary | ICD-10-CM

## 2014-09-03 DIAGNOSIS — R413 Other amnesia: Secondary | ICD-10-CM

## 2014-09-03 MED ORDER — CARBIDOPA-LEVODOPA 25-100 MG PO TABS: 1.0000 | ORAL_TABLET | Freq: Four times a day (QID) | ORAL | Status: DC

## 2014-09-03 NOTE — Progress Notes (Addendum)
PATIENT: Randy Davenport DOB: 10/08/1930  REASON FOR VISIT: follow up HISTORY FROM: patient  HISTORY OF PRESENT ILLNESS: Randy Davenport is a 80 year old male with a history of parkinson's disease. He is currently taking Sinemet 25-100 TID and tolerating it well. Patient and his son feel that his tremors has increased. His tremors are primarily in the right hand. Denies any issues with his walking.Denies any Falls.  Denies any issue with swallowing.No drooling. His son does notice at times he will mumble when he is talking. At the last visit the patient was started on Aricept 10 mg daily. He feels that his memory has remained about the same. Stating that his short term memory is worse than his long term. He is able to complete all ADLs without difficulty. He cooks his own meals and does the fiances with no difficulty. He is able to operate a motor vehicle without difficulty. His son does live with him but that's for his son's benefit. No new medical history since last seen.   HISTORY 05/03/14 ([place]): Randy Davenport is a 80 y.o. male here as a follow up of his Parkinson's disease. Overall doing well since last visit. He continues to take Sinemet 25-100 TID at 9am, 1 and 5pm. He notes good benefit from this medication. Good compliance, denies missing any doses. Concern if he is taking the correct medications though as daughter notes he has multiple different medications in the same pill bottle. No motor wearing off noted. Family notes some increased tremor of his hands. Main concern is difficulty with short term memory. Trouble recalling what he is supposed to be doing, forgetting conversations.   Prior visit 06/2014: Last visit was around 6 months ago and, he reports he is doing well overall since then. He is currently taking Mirapex 0.5 mg 3 times a day. Patient and son both feel he is slightly undermedicated and his symptoms are progressing. They note increased tremor, pre-dominantly a rest tremor,  generalized bradykinesia, slow main and softening of his voice with some dysarthria. He notes a hard time doing things with his hands. No gait instability no falls. No difficulty swallowing no sialorrhea. He is tolerating the Mirapex well, no impulse control disorder, no leg edema, no fatigue no hallucinations. Denies any constipation. Sleeping well no REM behavior disorder. No other acute concerns at this time  REVIEW OF SYSTEMS: Out of a complete 14 system review of symptoms, the patient complains only of the following symptoms, and all other reviewed systems are negative.  Memory loss, speech difficulty, tremors    ALLERGIES: No Known Allergies  HOME MEDICATIONS: Outpatient Prescriptions Prior to Visit  Medication Sig Dispense Refill  . carbidopa-levodopa (SINEMET IR) 25-100 MG per tablet 1 tablet TID at 9am, 1pm and 5pm 90 tablet 6  . citalopram (CELEXA) 10 MG tablet Take 10 mg by mouth daily.    Marland Kitchen. donepezil (ARICEPT) 10 MG tablet Take 1 tablet (10 mg total) by mouth at bedtime. 30 tablet 5  . omeprazole (PRILOSEC) 20 MG capsule     . diazepam (VALIUM) 5 MG tablet Take 1 tablet (5 mg total) by mouth every 12 (twelve) hours as needed for anxiety. (Patient not taking: Reported on 09/03/2014) 15 tablet 0   No facility-administered medications prior to visit.    PAST MEDICAL HISTORY: Past Medical History  Diagnosis Date  . Parkinson disease   . Hernia     right ing  . Recurrent upper respiratory infection (URI)  sinus infection week of 11/16/11   . Depression with anxiety     PAST SURGICAL HISTORY: Past Surgical History  Procedure Laterality Date  . Inguinal hernia repair  11/26/2011    Procedure: LAPAROSCOPIC INGUINAL HERNIA;  Surgeon: Atilano Ina, MD,FACS;  Location: [facility];  Service: General;  Laterality: Right;  Laparoscopic Right Inguinal Hernia Repair with Mesh    FAMILY HISTORY: Family History  Problem Relation Age of Onset  . Parkinson's disease Mother 75  .  Cancer Father   . Stroke Brother     Left Brain     SOCIAL HISTORY: History   Social History  . Marital Status: Widowed    Spouse Name: [name]     Number of Children: 3  . Years of Education: highschool   Occupational History  . Not on file.   Social History Main Topics  . Smoking status: Never Smoker   . Smokeless tobacco: Never Used  . Alcohol Use: No  . Drug Use: No  . Sexual Activity: Not on file   Other Topics Concern  . Not on file   Social History Narrative   Patient lives at home with wife [name].    Patient is retired from [employer].   Patient has 3 grown children.             PHYSICAL EXAM  Filed Vitals:   09/03/14 1106  BP: 125/72  Pulse: 55  Height: 5\' 8"  (1.727 m)  Weight: 173 lb 12.8 oz (78.835 kg)   Body mass index is 26.43 kg/(m^2).  Generalized: Well developed, in no acute distress   Neurological examination  Mentation: Alert oriented to time, place, history taking. Follows all commands speech and language fluent. MMSE 28/30. Cranial nerve II-XII: Pupils were equal round reactive to light. Extraocular movements were full, visual field were full on confrontational test. Facial sensation and strength were normal. Uvula tongue midline. Head turning and shoulder shrug  were normal and symmetric. Motor: The motor testing reveals 5 over 5 strength of all 4 extremities. Good symmetric motor tone is noted throughout. Mild resting tremor in the right arm Sensory: Sensory testing is intact to soft touch on all 4 extremities. No evidence of extinction is noted.  Coordination: Cerebellar testing reveals good finger-nose-finger and heel-to-shin bilaterally.  Gait and station: Gait is normal. Tandem gait is normal. Romberg is negative. No drift is seen.  Reflexes: Deep tendon reflexes are symmetric and normal bilaterally.   DIAGNOSTIC DATA (LABS, IMAGING, TESTING) - I reviewed patient records, labs, notes, testing and imaging myself where  available.  Lab Results  Component Value Date   WBC 6.0 11/23/2011   HGB 14.4 11/23/2011   HCT 44.7 11/23/2011   MCV 91.4 11/23/2011   PLT 177 11/23/2011      Component Value Date/Time   NA 139 11/23/2011 0905   K 4.5 11/23/2011 0905   CL 102 11/23/2011 0905   CO2 32 11/23/2011 0905   GLUCOSE 103* 11/23/2011 0905   BUN 21 11/23/2011 0905   CREATININE 1.28 11/23/2011 0905   CALCIUM 10.3 11/23/2011 0905   GFRNONAA 52* 11/23/2011 0905   GFRAA 60* 11/23/2011 0905     Lab Results  Component Value Date   VITAMINB12 536 05/03/2014   Lab Results  Component Value Date   TSH 2.480 05/03/2014      ASSESSMENT AND PLAN 80 y.o. year old male  has a past medical history of Parkinson disease; Hernia; Recurrent upper respiratory infection (URI); and  Depression with anxiety. here with:  1. Parkinson's disease 2. Memory deficit  Overall the patient has remained stable. He does feel that his tremor has gotten slightly worse. I will increase the Sinemet 25-100 milligrams to 4 times a day. The patient will continue taking Aricept 10 mg daily. His memory score today is 28/30. We will continue to monitor his memory over time. If his symptoms worsen or he develops new symptoms he should let us know. Otherwise he will follow-up in 6 months or sooner if needed.   Butch PennyMegan Areyanna Figeroa, MSN, NP-C 09/03/2014, 11:33 AM [facility] [address], [address] [place], [place] [address] [phone]  Note: This document was prepared with digital dictation and possible smart phrase technology. Any transcriptional errors that result from this process are unintentional.  I reviewed the above note and documentation by the Nurse Practitioner and agree with the history, physical exam, assessment and plan as outlined above. I was immediately available for face-to-face consultation. Huston FoleySaima Athar, MD, PhD [facility])

## 2014-09-03 NOTE — Patient Instructions (Signed)
Increase Sinemet 25-100 mg to four times a day. If your tremor gets worse please let us know If you develop any other symptoms that you are concerned about please do not hesitate to call.

## 2014-12-19 ENCOUNTER — Telehealth: Payer: Self-pay | Admitting: *Deleted

## 2014-12-19 NOTE — Telephone Encounter (Signed)
Patient will pick up records on Monday.

## 2015-01-13 ENCOUNTER — Other Ambulatory Visit: Payer: Self-pay | Admitting: Neurology

## 2015-01-16 ENCOUNTER — Other Ambulatory Visit: Payer: Self-pay | Admitting: Neurology

## 2015-01-17 NOTE — Telephone Encounter (Signed)
Last OV note says: I will increase the Sinemet 25-100 milligrams to 4 times a day

## 2015-03-14 ENCOUNTER — Ambulatory Visit: Payer: Medicare Other | Admitting: Adult Health

## 2015-04-07 ENCOUNTER — Other Ambulatory Visit: Payer: Self-pay | Admitting: Adult Health

## 2015-04-07 NOTE — Telephone Encounter (Signed)
Last appt cancelled noting: Patient (seeing a neurologist at [facility])

## 2015-04-22 ENCOUNTER — Ambulatory Visit: Payer: Medicare Other | Attending: Neurology

## 2015-04-22 DIAGNOSIS — R471 Dysarthria and anarthria: Secondary | ICD-10-CM

## 2015-04-22 NOTE — Patient Instructions (Signed)
Complete 5 loud "ah"s, twice a day

## 2015-04-22 NOTE — Therapy (Signed)
[facility] [address] [place], [place], [address] Phone: [phone]   Fax:  [phone]  Speech Language Pathology Evaluation  Patient Details  Name: Randy Davenport MRN: [id number] Date of Birth: 1930-11-24 Referring Provider:  Corie Chiquito, MD  Encounter Date: 04/22/2015      End of Session - 04/22/15 0854    Visit Number 1   Number of Visits 16   Date for SLP Re-Evaluation 06/21/15   SLP Start Time 0805   SLP Stop Time  0850   SLP Time Calculation (min) 45 min   Activity Tolerance Patient tolerated treatment well      Past Medical History  Diagnosis Date  . Parkinson disease   . Hernia     right ing  . Recurrent upper respiratory infection (URI)     sinus infection week of 11/16/11   . Depression with anxiety     Past Surgical History  Procedure Laterality Date  . Inguinal hernia repair  11/26/2011    Procedure: LAPAROSCOPIC INGUINAL HERNIA;  Surgeon: Atilano Ina, MD,FACS;  Location: [facility];  Service: General;  Laterality: Right;  Laparoscopic Right Inguinal Hernia Repair with Mesh    There were no vitals filed for this visit.  Visit Diagnosis: Dysarthria      Subjective Assessment - 04/22/15 0819    Patient is accompained by: Family member            SLP Evaluation [facility] - 04/22/15 8413    SLP Visit Information   SLP Received On 04/22/15   Medical Diagnosis Parkinson's disease   Subjective   Subjective "It almost comes across almost like he's mumbling."   Prior Functional Status   Cognitive/Linguistic Baseline Baseline deficits    Lives With Son   Education high school   Vocation Retired   IT consultant   Overall Cognitive Status History of cognitive impairments - at baseline   Memory Impaired   Memory Impairment Decreased short term memory   Auditory Comprehension   Overall Auditory Comprehension Appears within functional limits for tasks assessed   Verbal Expression   Overall Verbal Expression Impaired   Level of Generative/Spontaneous Verbalization Conversation   Naming Impairment   Other Naming Comments Anomia noted in conversation rarely today   Effective Techniques Semantic cues   Oral Motor/Sensory Function   Overall Oral Motor/Sensory Function Impaired   Labial ROM Within Functional Limits   Labial Coordination Reduced   Lingual ROM Reduced right;Reduced left   Lingual Symmetry Within Functional Limits   Lingual Strength Within Functional Limits   Lingual Coordination Reduced   Motor Speech   Respiration Impaired   Level of Impairment Sentence   Phonation Low vocal intensity   Articulation Impaired   Level of Impairment Sentence   Intelligibility Intelligibility reduced   Word --  90-95% in ST room; volume&articulation decr'd p 6-7 minutes   Sentence 75-100% accurate   Conversation 75-100% accurate  90-95% after 7 minutes--articulation&volume decr'd   Interfering Components --  decr'd cognitive-linguistic abilities   Effective Techniques Increased vocal intensity;Slow rate      Twelve minutes of conversational speech was reduced today, at average 67dB (WNL= average 70-72dB) with range of 61-72dB, when a sound level meter was placed 30 cm away from pt's mouth. Pt also has intermittent rushes of speech. As conversation progressed it became more challenging to understand pt as his volume decr'd. Overall intelligibility for this listener in a quiet environment was approx 95-100%. Production of loud /  a/ averaged 79dB (range of 78 to 84) and rare min cues needed for loudness. Pt rated effort level at 8/10 for production of loud /a/ (10=maximal effort).   Pt has noted mandibular tremor, and oral-motor skills as above. Pt does not note any regular s/s difficulty with swallowing during meals. SLP to monitor.  After evaluation tasks, in a short conversation task, pt was asked to use the same amount of effort as with loud /a/. Loudness average with  this increased effort was 70dB (range of 67 to 73) with usual min-mod verbal and demo cues for loudness. SLP provided feedback in real time to patient. Pt would benefit from skilled ST in order to improve speech intelligibility and pt's QOL.                    SLP Education - 05/10/15 1004    Education provided Yes   Education Details loud /a/, rationale for therapy   Person(s) Educated Patient;Caregiver(s)   Methods Explanation;Demonstration;Verbal cues   Comprehension Verbalized understanding;Returned demonstration;Verbal cues required          SLP Short Term Goals - 2015/05/10 1007    SLP SHORT TERM GOAL #1   Title pt will maintain loud /a/ with average >80dB over 4 sessions   Time 4   Period Weeks   Status New   SLP SHORT TERM GOAL #2   Title pt will provide sentence responses with volume >/=70dB 90% of the time   Time 4   Period Weeks   Status New   SLP SHORT TERM GOAL #3   Title pt will use speech >69dB in 3 minutes simple convesation   Time 4   Period Weeks   Status New          SLP Long Term Goals - 05/10/15 1008    SLP LONG TERM GOAL #1   Title pt will maintain loudness average 70dB in 8 minutes mod complex conversation with rare verbal cues   Time 8   Period Weeks   Status New   SLP LONG TERM GOAL #2   Title pt will maintain at least 80dB witih loud /a/ for 7 sessions   Time 8   Period Weeks   Status New   SLP LONG TERM GOAL #3   Title pt will tell SLP 3 s/s aspiration PNA with modified independence   Time 8   Period Weeks   Status New          Plan - 05/10/2015 1005    Clinical Impression Statement Pt presents with speech loudness reduced due to PArkinson's Disease. Pt would benefit from skilled ST x2/week to recalibrate speech system to habitualize louder speech.   Speech Therapy Frequency 2x / week   Duration --  8 weeks   Treatment/Interventions SLP instruction and feedback;Compensatory strategies;Patient/family  education;Functional tasks;Cueing hierarchy   Potential to Achieve Goals Good          G-Codes - 05-10-2015 1004    Functional Assessment Tool Used clinical judgement   Functional Limitations Motor speech   Motor Speech Current Status [phone]) At least 20 percent but less than 40 percent impaired, limited or restricted   Motor Speech Goal Status (N8295) At least 1 percent but less than 20 percent impaired, limited or restricted      Problem List Patient Active Problem List   Diagnosis Date Noted  . Parkinson's disease 06/14/2013    [facility] , MS, CCC-SLP  05/10/15, 10:18 AM  [place]  [facility] [address] [place], [place], [address] Phone: [phone]   Fax:  [phone]

## 2015-05-07 ENCOUNTER — Ambulatory Visit: Payer: Medicare Other | Admitting: Speech Pathology

## 2015-05-07 DIAGNOSIS — R471 Dysarthria and anarthria: Secondary | ICD-10-CM | POA: Diagnosis not present

## 2015-05-07 NOTE — Therapy (Signed)
[facility] [address] [place], [place], [address] Phone: [phone]   Fax:  [phone]  Speech Language Pathology Treatment  Patient Details  Name: Randy Davenport MRN: [id number] Date of Birth: Jun 18, 1931 Referring Provider:  Aleatha Borer, MD  Encounter Date: 05/07/2015      End of Session - 05/07/15 0935    Visit Number 2   Number of Visits 16   Date for SLP Re-Evaluation 06/21/15   SLP Start Time 0910   SLP Stop Time  0932   SLP Time Calculation (min) 22 min   Activity Tolerance Patient tolerated treatment well      Past Medical History  Diagnosis Date  . Parkinson disease   . Hernia     right ing  . Recurrent upper respiratory infection (URI)     sinus infection week of 11/16/11   . Depression with anxiety     Past Surgical History  Procedure Laterality Date  . Inguinal hernia repair  11/26/2011    Procedure: LAPAROSCOPIC INGUINAL HERNIA;  Surgeon: Atilano Ina, MD,FACS;  Location: [facility];  Service: General;  Laterality: Right;  Laparoscopic Right Inguinal Hernia Repair with Mesh    There were no vitals filed for this visit.  Visit Diagnosis: Dysarthria      Subjective Assessment - 05/07/15 0911    Subjective "I am supposed to talk loud, I guess" Pt arrived 9:07                ADULT SLP TREATMENT - 05/07/15 0913    General Information   Behavior/Cognition Alert;Cooperative;Pleasant mood   Treatment Provided   Treatment provided Cognitive-Linquistic   Pain Assessment   Pain Assessment No/denies pain   Cognitive-Linquistic Treatment   Treatment focused on Dysarthria   Skilled Treatment Loud /a/ average 75 dB - with usual min A for breath support and volume. Structured speech tasks with  consistent low  volume with frequent to consistent max A for breath support  and volume.  Homework provided   Assessment / Recommendations / Plan   Plan Continue with current plan of care   Progression Toward Goals   Progression toward goals Progressing toward goals          SLP Education - 05/07/15 0927    Education provided Yes   Education Details loud /a/ homework   Person(s) Educated Patient;Caregiver(s)   Methods Explanation;Demonstration;Verbal cues   Comprehension Verbalized understanding;Verbal cues required;Need further instruction          SLP Short Term Goals - 05/07/15 0934    SLP SHORT TERM GOAL #1   Title pt will maintain loud /a/ with average >80dB over 4 sessions   Time 4   Period Weeks   Status On-going   SLP SHORT TERM GOAL #2   Title pt will provide sentence responses with volume >/=70dB 90% of the time   Time 4   Period Weeks   Status On-going   SLP SHORT TERM GOAL #3   Title pt will use speech >69dB in 3 minutes simple convesation   Time 4   Period Weeks   Status On-going          SLP Long Term Goals - 05/07/15 0934    SLP LONG TERM GOAL #1   Title pt will maintain loudness average 70dB in 8 minutes mod complex conversation with rare verbal cues   Time 8   Period Weeks   Status On-going   SLP LONG TERM GOAL #  2   Title pt will maintain at least 80dB witih loud /a/ for 7 sessions   Time 8   Period Weeks   Status On-going   SLP LONG TERM GOAL #3   Title pt will tell SLP 3 s/s aspiration PNA with modified independence   Time 8   Period Weeks   Status On-going          Plan - 05/07/15 1610    Clinical Impression Statement Pt required max A for adequate volume. Continue skilled ST to maximize volume and intelligibility. Discussed attendenace policy with pt and instructed him to call if he is going to be late or miss session.   Speech Therapy Frequency 2x / week   Treatment/Interventions SLP instruction and feedback;Compensatory strategies;Patient/family education;Functional tasks;Cueing hierarchy   Potential to Achieve Goals Good        Problem List Patient Active Problem List   Diagnosis Date Noted  . Parkinson's  disease 06/14/2013    [name], Radene Journey MS, CCC-SLP 05/07/2015, 9:38 AM  [facility] [address] [place], [place], [address] Phone: [phone]   Fax:  [phone]

## 2015-05-07 NOTE — Patient Instructions (Signed)
  Loud "AH!" 5x twice a day  Read loud sentences with big breath before each "Think Shout!"  Big breath, more  Frequent breaths to keep up loudness

## 2015-05-09 ENCOUNTER — Ambulatory Visit: Payer: Medicare Other

## 2015-05-09 DIAGNOSIS — R471 Dysarthria and anarthria: Secondary | ICD-10-CM | POA: Diagnosis not present

## 2015-05-09 NOTE — Therapy (Signed)
[facility] [address] [place], [place], [address] Phone: [phone]   Fax:  [phone]  Speech Language Pathology Treatment  Patient Details  Name: Randy Davenport MRN: [id number] Date of Birth: May 05, 1931 Referring Provider:  Aleatha Borer, MD  Encounter Date: 05/09/2015      End of Session - 05/09/15 0958    Visit Number 3   Number of Visits 16   Date for SLP Re-Evaluation 06/21/15   SLP Start Time 0848   SLP Stop Time  0930   SLP Time Calculation (min) 42 min   Activity Tolerance Patient tolerated treatment well      Past Medical History  Diagnosis Date  . Parkinson disease   . Hernia     right ing  . Recurrent upper respiratory infection (URI)     sinus infection week of 11/16/11   . Depression with anxiety     Past Surgical History  Procedure Laterality Date  . Inguinal hernia repair  11/26/2011    Procedure: LAPAROSCOPIC INGUINAL HERNIA;  Surgeon: Atilano Ina, MD,FACS;  Location: [facility];  Service: General;  Laterality: Right;  Laparoscopic Right Inguinal Hernia Repair with Mesh    There were no vitals filed for this visit.  Visit Diagnosis: Dysarthria      Subjective Assessment - 05/09/15 0851    Subjective "I wonder if you can tell me why I'm so congested in my esophagus.Marland KitchenMarland KitchenI wonder if I have a toothpick down there."               ADULT SLP TREATMENT - 05/09/15 0852    General Information   Behavior/Cognition Cooperative;Pleasant mood   Treatment Provided   Treatment provided Cognitive-Linquistic   Pain Assessment   Pain Assessment No/denies pain   Cognitive-Linquistic Treatment   Treatment focused on Dysarthria   Skilled Treatment Pt's volume entering ST room in initial conversation was 67dB. SLP worked with pt for approx 10 minutes shaping his loud /a/ for abdominal push instead of  laryngeal push, full breath ("Take in too much air with these"), as well as opening mouth to  articulate /a/ instead of "uh". Pt perseverating on congestion in throat today. SLP encouraged sipping on water with effortful swallows. Pt req'd usual mod verbal and demo cues for loud /a/ after explanation/practice. SLP provided opportunity for pt to practice louder speech in order to habitualize muscle movement needed  in sentences. Pt with max verbal and demo cues usually in order to keep volume above 68dB.    Assessment / Recommendations / Plan   Plan Continue with current plan of care   Progression Toward Goals   Progression toward goals Progressing toward goals          SLP Education - 05/09/15 0957    Education provided Yes   Education Details loud /a/   Person(s) Educated Patient   Methods Explanation;Demonstration;Verbal cues   Comprehension Verbalized understanding;Verbal cues required;Need further instruction          SLP Short Term Goals - 05/07/15 0934    SLP SHORT TERM GOAL #1   Title pt will maintain loud /a/ with average >80dB over 4 sessions   Time 4   Period Weeks   Status On-going   SLP SHORT TERM GOAL #2   Title pt will provide sentence responses with volume >/=70dB 90% of the time   Time 4   Period Weeks   Status On-going   SLP SHORT TERM GOAL #3  Title pt will use speech >69dB in 3 minutes simple convesation   Time 4   Period Weeks   Status On-going          SLP Long Term Goals - 05/07/15 0934    SLP LONG TERM GOAL #1   Title pt will maintain loudness average 70dB in 8 minutes mod complex conversation with rare verbal cues   Time 8   Period Weeks   Status On-going   SLP LONG TERM GOAL #2   Title pt will maintain at least 80dB witih loud /a/ for 7 sessions   Time 8   Period Weeks   Status On-going   SLP LONG TERM GOAL #3   Title pt will tell SLP 3 s/s aspiration PNA with modified independence   Time 8   Period Weeks   Status On-going          Plan - 05/09/15 1610    Clinical Impression Statement Pt required usual max A for  adequate volume. Continue skilled ST to maximize volume and intelligibility. Discussed attendenace policy with pt and instructed him to call if he is going to be late or miss session.   Speech Therapy Frequency 2x / week   Duration --  8 weeks   Treatment/Interventions SLP instruction and feedback;Compensatory strategies;Patient/family education;Functional tasks;Cueing hierarchy   Potential to Achieve Goals Good        Problem List Patient Active Problem List   Diagnosis Date Noted  . Parkinson's disease 06/14/2013    [facility] , MS, CCC-SLP  05/09/2015, 10:03 AM  [facility] [address] [place], [place], [address] Phone: [phone]   Fax:  [phone]

## 2015-05-09 NOTE — Patient Instructions (Signed)
  Please complete the assigned speech therapy homework prior to your next session.  Every day, you must: Do 5 repetitions of loud "ah" twice a day Do 30-40 minutes of speech tasks on handouts LOUD Read aloud LOUDLY

## 2015-05-14 ENCOUNTER — Ambulatory Visit: Payer: Medicare Other

## 2015-05-14 DIAGNOSIS — R471 Dysarthria and anarthria: Secondary | ICD-10-CM

## 2015-05-14 NOTE — Therapy (Addendum)
[facility] [address] [place], [place], [address] Phone: [phone]   Fax:  [phone]  Speech Language Pathology Treatment  Patient Details  Name: TIGE MEAS MRN: [id number] Date of Birth: 1930/10/06 Referring Provider:  Molli Barrows, MD  Encounter Date: 05/14/2015      End of Session - 05/14/15 1655    Visit Number 4   Number of Visits 16   Date for SLP Re-Evaluation 06/21/15   SLP Start Time 1533   SLP Stop Time  1613   SLP Time Calculation (min) 40 min   Activity Tolerance Patient tolerated treatment well      Past Medical History  Diagnosis Date  . Parkinson disease   . Hernia     right ing  . Recurrent upper respiratory infection (URI)     sinus infection week of 11/16/11   . Depression with anxiety     Past Surgical History  Procedure Laterality Date  . Inguinal hernia repair  11/26/2011    Procedure: LAPAROSCOPIC INGUINAL HERNIA;  Surgeon: Gayland Curry, MD,FACS;  Location: [facility];  Service: General;  Laterality: Right;  Laparoscopic Right Inguinal Hernia Repair with Mesh    There were no vitals filed for this visit.  Visit Diagnosis: Dysarthria      Subjective Assessment - 05/14/15 1536    Subjective Pt no-showed his appointment this morning - rescheduled to 3:30 today.               ADULT SLP TREATMENT - 05/14/15 1536    General Information   Behavior/Cognition Cooperative;Pleasant mood   Treatment Provided   Treatment provided Cognitive-Linquistic   Pain Assessment   Pain Assessment No/denies pain   Cognitive-Linquistic Treatment   Treatment focused on Dysarthria   Skilled Treatment SLP used loud /a/ to familiarize pt with muscle memory for louder WNL speech. Pt performed loud /a'/ average 82dB with pt getting louder as reps progressed with SLP usual mod A for loudness and full breath. Pt reporting he completes at least 5 reps at least twice a day. SLP told pt no more  than 6 reps no more than twice a day with the same effort he just used with SLP with loud /a/. In structured tasks, pt req'd consistent mod A for loudness in simple sentence responses. Pt perseverating (approx 4 times during session) that he is not sure if he can achieve WNL loudness when focusing on louder speech. SLP encouraged pt he would need to practice at home with sentence responses thinking about louder speech/incr'd effort PRIOR to answering, as he stated he does not think of louder production prior to answering.   Assessment / Recommendations / Plan   Plan Continue with current plan of care   Progression Toward Goals   Progression toward goals Progressing toward goals          SLP Education - 05/14/15 1654    Education provided Yes   Education Details loud /a/, louder speech focus PRIOR to responding   Person(s) Educated Patient   Methods Explanation;Demonstration;Verbal cues   Comprehension Verbalized understanding;Verbal cues required;Need further instruction          SLP Short Term Goals - 05/14/15 1658    SLP SHORT TERM GOAL #1   Title pt will maintain loud /a/ with average >80dB over 4 sessions   Time 3   Period Weeks   Status On-going   SLP SHORT TERM GOAL #2   Title pt will provide sentence  responses with volume >/=70dB 90% of the time   Time 3   Period Weeks   Status On-going   SLP SHORT TERM GOAL #3   Title pt will use speech >69dB in 3 minutes simple convesation   Time 3   Period Weeks   Status On-going          SLP Long Term Goals - 05/14/15 1658    SLP LONG TERM GOAL #1   Title pt will maintain loudness average 70dB in 8 minutes mod complex conversation with rare verbal cues   Time 7   Period Weeks   Status On-going   SLP LONG TERM GOAL #2   Title pt will maintain at least 80dB witih loud /a/ for 7 sessions   Time 7   Period Weeks   Status On-going   SLP LONG TERM GOAL #3   Title pt will tell SLP 3 s/s aspiration PNA with modified  independence   Time 7   Period Weeks   Status On-going          Plan - 05/14/15 1655    Clinical Impression Statement Pt required consistent mod A today for adequate volume in sentence tasks, and was only minimally successful. He also cont to require cues for loudness and full breath with loud /a/. Skilled ST needs to cont to maximize carryover to outside ST sessions to improve pt's overall communication.   Speech Therapy Frequency 2x / week   Duration --  7 weeks   Treatment/Interventions SLP instruction and feedback;Compensatory strategies;Patient/family education;Functional tasks;Cueing hierarchy   Potential to Achieve Goals Fair   Potential Considerations Severity of impairments;Ability to learn/carryover information     [place] SUMMARY  Visits from Start of Care: 3 therapy visits, one evaluation  Current functional level related to goals / functional outcomes: Pt was able to initiate loud "ah" with cues from SLP for maintaining loudness throughout duration, as well as taking a full breath prior to phonating. Transfer of loud speech to sentence responses was challenging for patient and req'd SLP cues. On visit of 05-14-15 pt often responded with comments of incapability of achieving loudness due to being "soft spoken". SLP continually explained WNL loudness is a range, including "soft spoken" individuals. Word level responses were with WNL loudness mostly without SLP cues necessary.  On 05-16-15 SLP received notification from front office that pt's son called on behalf of pt and cancelled all remaining ST sessions due to pt request.  Remaining deficits: Deficits remain. See above goal summary   Education / Equipment: Loud "ah", need to complete HEP consistently.  Plan: Patient agrees to discharge.  Patient goals were not met. Patient is being discharged due to the patient's request.  ?????        Problem List Patient Active Problem List   Diagnosis Date  Noted  . Parkinson's disease 06/14/2013    [facility] , [place], [place]  05/14/2015, 4:58 PM  [facility] [address] [place], [place], [address] Phone: [phone]   Fax:  [phone]

## 2015-05-14 NOTE — Patient Instructions (Signed)
You must think about louder speech BEFORE you answer.  Complete the provided homework to practice this.

## 2015-05-16 ENCOUNTER — Ambulatory Visit: Payer: Medicare Other

## 2015-05-16 NOTE — Therapy (Signed)
[facility] [address] [place], [place], [address] Phone: [phone]   Fax:  [phone]  Patient Details  Name: Randy Davenport MRN: [id number] Date of Birth: [birth year] Referring Provider:  No ref. provider found  Encounter Date: 05/16/2015  SLP received notification from front office that pt's son called for pt in order to cancel remaining ST appointments, and that pt either did not think ST was beneficial for him or was "not working for him".  Front office was told by pt's son stated he tried to convince pt it was beneficial but pt adamant to cancel remaining appointments.  [facility] , MS, CCC-SLP  05/16/2015, 10:39 AM  [facility] [address] [place], [place], [address] Phone: [phone]   Fax:  [phone]

## 2015-05-21 ENCOUNTER — Ambulatory Visit: Payer: Medicare Other

## 2015-05-23 ENCOUNTER — Ambulatory Visit: Payer: Medicare Other | Admitting: Speech Pathology

## 2015-05-30 ENCOUNTER — Encounter: Payer: Medicare Other | Admitting: Speech Pathology

## 2015-07-26 ENCOUNTER — Encounter (HOSPITAL_COMMUNITY): Payer: Self-pay | Admitting: Emergency Medicine

## 2015-07-26 ENCOUNTER — Emergency Department (HOSPITAL_COMMUNITY)
Admission: EM | Admit: 2015-07-26 | Discharge: 2015-07-26 | Disposition: A | Discharge status: Home / Self Care | Payer: Medicare Other | Attending: Emergency Medicine | Admitting: Emergency Medicine

## 2015-07-26 DIAGNOSIS — Z8719 Personal history of other diseases of the digestive system: Secondary | ICD-10-CM | POA: Insufficient documentation

## 2015-07-26 DIAGNOSIS — Z8709 Personal history of other diseases of the respiratory system: Secondary | ICD-10-CM | POA: Diagnosis not present

## 2015-07-26 DIAGNOSIS — M542 Cervicalgia: Secondary | ICD-10-CM | POA: Insufficient documentation

## 2015-07-26 DIAGNOSIS — G20 Parkinson's disease: Secondary | ICD-10-CM | POA: Diagnosis not present

## 2015-07-26 DIAGNOSIS — Z79899 Other long term (current) drug therapy: Secondary | ICD-10-CM | POA: Insufficient documentation

## 2015-07-26 DIAGNOSIS — F418 Other specified anxiety disorders: Secondary | ICD-10-CM | POA: Insufficient documentation

## 2015-07-26 MED ORDER — IBUPROFEN 600 MG PO TABS: 600.0000 mg | ORAL_TABLET | Freq: Three times a day (TID) | ORAL | Status: DC | PRN

## 2015-07-26 MED ORDER — CYCLOBENZAPRINE HCL 10 MG PO TABS: 10.0000 mg | ORAL_TABLET | Freq: Three times a day (TID) | ORAL | Status: DC | PRN

## 2015-07-26 MED ORDER — IBUPROFEN 200 MG PO TABS
600.0000 mg | ORAL_TABLET | Freq: Once | ORAL | Status: AC
  Administered 2015-07-26: 600 mg via ORAL
  Filled 2015-07-26: qty 3

## 2015-07-26 NOTE — ED Provider Notes (Signed)
CSN: [id number]   Arrival date & time 07/26/15 0123  History  By signing my name below, I, Bethel BornBritney McCollum, attest that this documentation has been prepared under the direction and in the presence of Azalia BilisKevin Erasmo Vertz, MD. Electronically Signed: Bethel BornBritney McCollum, ED Scribe. 07/26/2015. 2:31 AM.  Chief Complaint  Patient presents with  . Neck Pain    HPI The history is provided by the patient. No language interpreter was used.   Randy Davenport is a 81 y.o. male who presents to the Emergency Department complaining of new and constant right-sided neck pain with onset yesterday upon waking. The pain is worse with movement of the neck in any direction. Tylenol provided insufficient pain relief at home (last dose was 2 hours ago). He has been congested but denies  dental pain, sore throat, difficulty breathing. Pt has no PCP.   Past Medical History  Diagnosis Date  . Parkinson disease (HCC)   . Hernia     right ing  . Recurrent upper respiratory infection (URI)     sinus infection week of 11/16/11   . Depression with anxiety     Past Surgical History  Procedure Laterality Date  . Inguinal hernia repair  11/26/2011    Procedure: LAPAROSCOPIC INGUINAL HERNIA;  Surgeon: Atilano InaEric M Wilson, MD,FACS;  Location: [facility];  Service: General;  Laterality: Right;  Laparoscopic Right Inguinal Hernia Repair with Mesh    Family History  Problem Relation Age of Onset  . Parkinson's disease Mother 8088  . Cancer Father   . Stroke Brother     Left Brain     Social History  Substance Use Topics  . Smoking status: Never Smoker   . Smokeless tobacco: Never Used  . Alcohol Use: No     Review of Systems 10 Systems reviewed and all are negative for acute change except as noted in the HPI. Home Medications   Prior to Admission medications   Medication Sig Start Date End Date Taking? Authorizing Provider  carbidopa-levodopa (SINEMET CR) 50-200 MG per tablet Take 1 tablet by mouth daily at 10 pm.    Historical  Provider, MD  carbidopa-levodopa (SINEMET IR) 25-100 MG per tablet Take 1 tablet by mouth 4 (four) times daily. 09/03/14   Butch PennyMegan Millikan, NP  carbidopa-levodopa (SINEMET IR) 25-100 MG per tablet Take 1 tablet by mouth 4 (four) times daily. 01/17/15   Butch PennyMegan Millikan, NP  citalopram (CELEXA) 10 MG tablet Take 10 mg by mouth daily.    Historical Provider, MD  cyclobenzaprine (FLEXERIL) 10 MG tablet Take 1 tablet (10 mg total) by mouth 3 (three) times daily as needed for muscle spasms. 07/26/15   Azalia BilisKevin Meaghan Whistler, MD  diazepam (VALIUM) 5 MG tablet Take 1 tablet (5 mg total) by mouth every 12 (twelve) hours as needed for anxiety. Patient not taking: Reported on 09/03/2014 12/27/13   Ramond MarrowPeter J Sumner, DO  donepezil (ARICEPT) 10 MG tablet Take by mouth. 06/27/14   Historical Provider, MD  donepezil (ARICEPT) 10 MG tablet TAKE 1 TABLET (10 MG TOTAL) BY MOUTH AT BEDTIME. 01/13/15   Butch PennyMegan Millikan, NP  ibuprofen (ADVIL,MOTRIN) 600 MG tablet Take 1 tablet (600 mg total) by mouth every 8 (eight) hours as needed. 07/26/15   Azalia BilisKevin Lis Savitt, MD  omeprazole (PRILOSEC) 20 MG capsule  09/28/13   Historical Provider, MD    Allergies  Review of patient's allergies indicates no known allergies.  Triage Vitals: BP 146/70 mmHg  Pulse 59  Temp(Src) 97.7 F (36.5 C) (Oral)  Resp 16  Ht  (1.727 m)  Wt 173 lb (78.472 kg)  BMI 26.31 kg/m2  SpO2 95%  Physical Exam  Constitutional: He is oriented to person, place, and time. He appears well-developed and well-nourished.  HENT:  Head: Normocephalic and atraumatic.  Eyes: EOM are normal.  Neck: Normal range of motion.  FROM of neck Mild tenderness without tenderness at right SCM No supraclavicular nodes    Cardiovascular: Normal rate, regular rhythm, normal heart sounds and intact distal pulses.   Pulmonary/Chest: Effort normal and breath sounds normal. No respiratory distress.  Abdominal: Soft. He exhibits no distension. There is no tenderness.  Musculoskeletal:  Normal range of motion.  Lymphadenopathy:    He has no cervical adenopathy.  Neurological: He is alert and oriented to person, place, and time.  Skin: Skin is warm and dry.  Psychiatric: He has a normal mood and affect. Judgment normal.  Nursing note and vitals reviewed.   ED Course  Procedures   DIAGNOSTIC STUDIES: Oxygen Saturation is 95% on RA, normal by my interpretation.    COORDINATION OF CARE: 2:26 AM Discussed treatment plan which includes heat, muscle relaxant, and Ibuprofen with pt at bedside and pt agreed to plan.  MDM   Final diagnoses:  Neck pain    Patient's pain seems most consistent with some sort of sternocleidomastoid spasm or strain.  He has no associated lymphadenopathy.  His trachea is midline.  Tolerating secretions.  Oral airway is patent.  For range of motion.  Doubt meningitis.  No history of trauma to suggest carotid dissection.  No obvious bruit noted.  Primary care follow-up.  Home with muscle relaxants, heat, anti-inflammatories.  I personally performed the services described in this documentation, which was scribed in my presence. The recorded information has been reviewed and is accurate.       Azalia Bilis, MD 07/26/15 [phone]

## 2015-07-26 NOTE — ED Notes (Signed)
Patient left at this time with all belongings. Refused wheelchair 

## 2015-07-26 NOTE — ED Notes (Signed)
Pt. reports right neck pain onset yesterday , denies injury , pain increases with movement , no fever or headache .

## 2016-07-22 ENCOUNTER — Emergency Department (HOSPITAL_COMMUNITY)
Admission: EM | Admit: 2016-07-22 | Discharge: 2016-07-22 | Disposition: A | Discharge status: Home / Self Care | Payer: Medicare Other | Attending: Emergency Medicine | Admitting: Emergency Medicine

## 2016-07-22 ENCOUNTER — Encounter (HOSPITAL_COMMUNITY): Payer: Self-pay | Admitting: Emergency Medicine

## 2016-07-22 ENCOUNTER — Emergency Department (HOSPITAL_COMMUNITY): Payer: Medicare Other

## 2016-07-22 DIAGNOSIS — J029 Acute pharyngitis, unspecified: Secondary | ICD-10-CM | POA: Insufficient documentation

## 2016-07-22 DIAGNOSIS — J40 Bronchitis, not specified as acute or chronic: Secondary | ICD-10-CM | POA: Diagnosis not present

## 2016-07-22 DIAGNOSIS — G20 Parkinson's disease: Secondary | ICD-10-CM | POA: Diagnosis not present

## 2016-07-22 LAB — RAPID STREP SCREEN (MED CTR MEBANE ONLY): Streptococcus, Group A Screen (Direct): NEGATIVE

## 2016-07-22 MED ORDER — AMOXICILLIN-POT CLAVULANATE 875-125 MG PO TABS: 1.0000 | ORAL_TABLET | Freq: Two times a day (BID) | ORAL | 0 refills | Status: AC

## 2016-07-22 MED ORDER — AZITHROMYCIN 250 MG PO TABS: 250.0000 mg | ORAL_TABLET | Freq: Every day | ORAL | 0 refills | Status: DC

## 2016-07-22 NOTE — ED Triage Notes (Signed)
Patient is complaining of sore throat and non -productive cough, for the past 3 days.  Denies fever.  Denies N/V/D.

## 2016-07-22 NOTE — ED Provider Notes (Signed)
[facility]-EMERGENCY DEPT Provider Note   CSN: [id number] Arrival date & time: 07/22/16  1331     History   Chief Complaint Chief Complaint  Patient presents with  . Sore Throat  . Cough    HPI Randy Davenport is a 82 y.o. male.  The history is provided by the patient and the spouse.  Sore Throat  This is a new problem. Episode onset: 4 days ago. The problem occurs constantly. The problem has been gradually worsening. Pertinent negatives include no abdominal pain and no shortness of breath. Associated symptoms comments: Sore throat, minimally productive cough and some congestion.  No rhinorrhea or fever.  No N/V/D. The symptoms are aggravated by swallowing. Relieved by: some help with nyquil. Treatments tried: otc. The treatment provided no relief.  Cough  Pertinent negatives include no shortness of breath.    Past Medical History:  Diagnosis Date  . Depression with anxiety   . Hernia    right ing  . Parkinson disease (HCC)   . Recurrent upper respiratory infection (URI)    sinus infection week of 11/16/11     Patient Active Problem List   Diagnosis Date Noted  . Parkinson's disease (HCC) 06/14/2013    Past Surgical History:  Procedure Laterality Date  . INGUINAL HERNIA REPAIR  11/26/2011   Procedure: LAPAROSCOPIC INGUINAL HERNIA;  Surgeon: Atilano InaEric M Wilson, MD,FACS;  Location: [facility];  Service: General;  Laterality: Right;  Laparoscopic Right Inguinal Hernia Repair with Mesh       Home Medications    Prior to Admission medications   Medication Sig Start Date End Date Taking? Authorizing Provider  carbidopa-levodopa (SINEMET CR) 50-200 MG per tablet Take 1 tablet by mouth daily at 10 pm.   Yes Historical Provider, MD  donepezil (ARICEPT) 10 MG tablet Take by mouth. 06/27/14   Historical Provider, MD  donepezil (ARICEPT) 10 MG tablet TAKE 1 TABLET (10 MG TOTAL) BY MOUTH AT BEDTIME. 01/13/15   Butch PennyMegan Millikan, NP  omeprazole (PRILOSEC) 20 MG capsule  09/28/13   Historical  Provider, MD    Family History Family History  Problem Relation Age of Onset  . Parkinson's disease Mother 2988  . Cancer Father   . Stroke Brother     Left Brain     Social History Social History  Substance Use Topics  . Smoking status: Never Smoker  . Smokeless tobacco: Never Used  . Alcohol use No     Allergies   Patient has no known allergies.   Review of Systems Review of Systems  Respiratory: Positive for cough. Negative for shortness of breath.   Gastrointestinal: Negative for abdominal pain.  All other systems reviewed and are negative.    Physical Exam Updated Vital Signs BP 147/84 (BP Location: Right Arm)   Pulse (!) 55   Temp 98.4 F (36.9 C) (Oral)   Resp 18   Ht 5\' 8"  (1.727 m)   Wt 162 lb (73.5 kg)   SpO2 99%   BMI 24.63 kg/m   Physical Exam  Constitutional: He is oriented to person, place, and time. He appears well-developed and well-nourished. No distress.  HENT:  Head: Normocephalic and atraumatic.  Nose: Mucosal edema present. No rhinorrhea.  Mouth/Throat: Mucous membranes are normal. No oral lesions. No uvula swelling. Posterior oropharyngeal erythema present. No oropharyngeal exudate or posterior oropharyngeal edema.  Cerumen impaction bilaterally  Eyes: Conjunctivae and EOM are normal. Pupils are equal, round, and reactive to light.  Neck: Normal range of motion. Neck  supple.  Cardiovascular: Normal rate, regular rhythm and intact distal pulses.   No murmur heard. Pulmonary/Chest: Effort normal and breath sounds normal. No respiratory distress. He has no wheezes. He has no rales.  Abdominal: Soft. He exhibits no distension. There is no tenderness. There is no rebound and no guarding.  Musculoskeletal: Normal range of motion. He exhibits no edema or tenderness.  Neurological: He is alert and oriented to person, place, and time.  Mild resting tremor in the side of face and hand  Skin: Skin is warm and dry. No rash noted. No erythema.    Psychiatric: He has a normal mood and affect. His behavior is normal.  Nursing note and vitals reviewed.    ED Treatments / Results  Labs (all labs ordered are listed, but only abnormal results are displayed) Labs Reviewed  RAPID STREP SCREEN (NOT AT [facility])  CULTURE, GROUP A STREP [facility])    EKG  EKG Interpretation None       Radiology Dg Chest 2 View  Result Date: 07/22/2016 CLINICAL DATA:  Sore throat and cough over the last 3 days. EXAM: CHEST  2 VIEW COMPARISON:  11/23/2011 FINDINGS: Prominent bronchial pattern without evidence of infiltrate, collapse or effusion. No bone abnormality. Heart size is normal. Mediastinal shadows are normal except for some aortic calcification. IMPRESSION: Bronchitis.  No consolidation or collapse. Electronically Signed   By: Paulina FusiMark  Shogry M.D.   On: 07/22/2016 14:55    Procedures Procedures (including critical care time)  Medications Ordered in ED Medications - No data to display   Initial Impression / Assessment and Plan / ED Course  I have reviewed the triage vital signs and the nursing notes.  Pertinent labs & imaging results that were available during my care of the patient were reviewed by me and considered in my medical decision making (see chart for details).  Clinical Course    Pt with symptoms consistent with viral URI/pharyngitis.  Well appearing here with normal VS.  No signs of breathing difficulty and pt denies chest pain or SOB.  No signs of otitis or abnormal abdominal findings.  Low suspicion for ACS or PNA.  No signs concerning for Epiglottitis, RPA or PTA. Patient is able to tolerate his own secretions and wife states he ate a large breakfast this morning without any choking or difficulty swallowing.   CXR shows bronchitis and rapid strep neg and pt to return with any further problems.  Given augmentin    Final Clinical Impressions(s) / ED Diagnoses   Final diagnoses:  Bronchitis  Sore throat    New  Prescriptions New Prescriptions   AMOXICILLIN-CLAVULANATE (AUGMENTIN) 875-125 MG TABLET    Take 1 tablet by mouth every 12 (twelve) hours.     Gwyneth SproutWhitney Huma Imhoff, MD 07/22/16 [phone]

## 2016-07-22 NOTE — ED Notes (Signed)
Patient transported to X-ray 

## 2016-07-25 ENCOUNTER — Emergency Department (HOSPITAL_COMMUNITY)
Admission: EM | Admit: 2016-07-25 | Discharge: 2016-07-25 | Disposition: A | Discharge status: Home / Self Care | Payer: Medicare Other | Attending: Emergency Medicine | Admitting: Emergency Medicine

## 2016-07-25 ENCOUNTER — Encounter (HOSPITAL_COMMUNITY): Payer: Self-pay | Admitting: Emergency Medicine

## 2016-07-25 DIAGNOSIS — G20 Parkinson's disease: Secondary | ICD-10-CM | POA: Insufficient documentation

## 2016-07-25 DIAGNOSIS — J029 Acute pharyngitis, unspecified: Secondary | ICD-10-CM | POA: Diagnosis present

## 2016-07-25 DIAGNOSIS — K222 Esophageal obstruction: Secondary | ICD-10-CM | POA: Diagnosis not present

## 2016-07-25 DIAGNOSIS — T18128A Food in esophagus causing other injury, initial encounter: Secondary | ICD-10-CM

## 2016-07-25 LAB — CULTURE, GROUP A STREP (THRC)

## 2016-07-25 NOTE — ED Triage Notes (Signed)
Per pt, states throat sore-was seen here on the 8th for the same symptoms-being treated with antibiotics

## 2016-07-25 NOTE — Discharge Instructions (Signed)
Follow-up with a gastroenterologist if your symptoms recur.

## 2016-07-25 NOTE — ED Provider Notes (Signed)
[facility] Provider Note   CSN: [id number] Arrival date & time: 07/25/16  1708     History   Chief Complaint Chief Complaint  Patient presents with  . Sore Throat    HPI Randy Davenport is a 82 y.o. male.  Patient is an 82 year old male with history of Parkinson's disease. He presents for evaluation of throat pain that occurred earlier today. He reports eating a large piece persimmon that he did not chew up very well. This apparently got stuck in his throat. After several minutes, it ultimately passed. He is having no symptoms now. He presents to be evaluated for this to make sure "everything is okay".   The history is provided by the patient.  Sore Throat  This is a new problem. Episode onset: This morning. The problem has been resolved. Nothing aggravates the symptoms. Nothing relieves the symptoms. He has tried nothing for the symptoms.    Past Medical History:  Diagnosis Date  . Depression with anxiety   . Hernia    right ing  . Parkinson disease (HCC)   . Recurrent upper respiratory infection (URI)    sinus infection week of 11/16/11     Patient Active Problem List   Diagnosis Date Noted  . Parkinson's disease (HCC) 06/14/2013    Past Surgical History:  Procedure Laterality Date  . INGUINAL HERNIA REPAIR  11/26/2011   Procedure: LAPAROSCOPIC INGUINAL HERNIA;  Surgeon: Atilano InaEric M Wilson, MD,FACS;  Location: [facility];  Service: General;  Laterality: Right;  Laparoscopic Right Inguinal Hernia Repair with Mesh       Home Medications    Prior to Admission medications   Medication Sig Start Date End Date Taking? Authorizing Provider  amoxicillin-clavulanate (AUGMENTIN) 875-125 MG tablet Take 1 tablet by mouth every 12 (twelve) hours. 07/22/16   Gwyneth SproutWhitney Plunkett, MD  carbidopa-levodopa (SINEMET CR) 50-200 MG per tablet Take 1 tablet by mouth daily at 10 pm.    Historical Provider, MD  diazepam (VALIUM) 5 MG tablet Take 5-7.5 mg by mouth 2 (two) times daily as  needed for anxiety.     Historical Provider, MD  DM-Doxylamine-Acetaminophen (NYQUIL COLD & FLU PO) Take 15 mLs by mouth at bedtime as needed (cough).    Historical Provider, MD  QUEtiapine (SEROQUEL) 50 MG tablet Take 50 mg by mouth 2 (two) times daily.    Historical Provider, MD  ranitidine (ZANTAC) 150 MG capsule Take 150 mg by mouth daily.    Historical Provider, MD    Family History Family History  Problem Relation Age of Onset  . Parkinson's disease Mother 5988  . Cancer Father   . Stroke Brother     Left Brain     Social History Social History  Substance Use Topics  . Smoking status: Never Smoker  . Smokeless tobacco: Never Used  . Alcohol use No     Allergies   Patient has no known allergies.   Review of Systems Review of Systems  All other systems reviewed and are negative.    Physical Exam Updated Vital Signs BP 124/75 (BP Location: Right Arm)   Pulse 75   Resp 16   SpO2 98%   Physical Exam  Constitutional: He is oriented to person, place, and time. He appears well-developed and well-nourished. No distress.  HENT:  Head: Normocephalic and atraumatic.  Mouth/Throat: Oropharynx is clear and moist.  Neck: Normal range of motion. Neck supple.  Cardiovascular: Normal rate and regular rhythm.  Exam reveals no friction rub.  No murmur heard. Pulmonary/Chest: Effort normal and breath sounds normal. No respiratory distress. He has no wheezes. He has no rales.  Abdominal: Soft. Bowel sounds are normal. He exhibits no distension. There is no tenderness.  Musculoskeletal: Normal range of motion. He exhibits no edema.  Neurological: He is alert and oriented to person, place, and time. Coordination normal.  Skin: Skin is warm and dry. He is not diaphoretic.  Nursing note and vitals reviewed.    ED Treatments / Results  Labs (all labs ordered are listed, but only abnormal results are displayed) Labs Reviewed - No data to display  EKG  EKG  Interpretation None       Radiology No results found.  Procedures Procedures (including critical care time)  Medications Ordered in ED Medications - No data to display   Initial Impression / Assessment and Plan / ED Course  I have reviewed the triage vital signs and the nursing notes.  Pertinent labs & imaging results that were available during my care of the patient were reviewed by me and considered in my medical decision making (see chart for details).  Clinical Course     The patient has no symptoms. It sounds as if he had a piece of persimmon transiently lodged in his esophagus. I see no reason for workup at this time. He will be discharged home, to follow-up with gastroenterology if his symptoms recur.  Final Clinical Impressions(s) / ED Diagnoses   Final diagnoses:  None    New Prescriptions New Prescriptions   No medications on file     Geoffery Lyonsouglas Cheryn Lundquist, MD 07/25/16 1751

## 2016-08-07 ENCOUNTER — Encounter (HOSPITAL_COMMUNITY): Payer: Self-pay | Admitting: Emergency Medicine

## 2016-08-07 ENCOUNTER — Emergency Department (HOSPITAL_COMMUNITY): Payer: Medicare Other

## 2016-08-07 ENCOUNTER — Emergency Department (HOSPITAL_COMMUNITY)
Admission: EM | Admit: 2016-08-07 | Discharge: 2016-08-07 | Disposition: A | Discharge status: Home / Self Care | Payer: Medicare Other | Attending: Emergency Medicine | Admitting: Emergency Medicine

## 2016-08-07 DIAGNOSIS — R1310 Dysphagia, unspecified: Secondary | ICD-10-CM

## 2016-08-07 DIAGNOSIS — Z79899 Other long term (current) drug therapy: Secondary | ICD-10-CM | POA: Diagnosis not present

## 2016-08-07 DIAGNOSIS — G20 Parkinson's disease: Secondary | ICD-10-CM | POA: Insufficient documentation

## 2016-08-07 LAB — CBC WITH DIFFERENTIAL/PLATELET
Basophils Absolute: 0 K/uL (ref 0.0–0.1)
Basophils Relative: 1 %
Eosinophils Absolute: 0.3 K/uL (ref 0.0–0.7)
Eosinophils Relative: 5 %
HCT: 40.1 % (ref 39.0–52.0)
Hemoglobin: 13 g/dL (ref 13.0–17.0)
Lymphocytes Relative: 32 %
Lymphs Abs: 2 K/uL (ref 0.7–4.0)
MCH: 30.2 pg (ref 26.0–34.0)
MCHC: 32.4 g/dL (ref 30.0–36.0)
MCV: 93.3 fL (ref 78.0–100.0)
Monocytes Absolute: 0.3 K/uL (ref 0.1–1.0)
Monocytes Relative: 5 %
Neutro Abs: 3.5 K/uL (ref 1.7–7.7)
Neutrophils Relative %: 57 %
Platelets: 213 K/uL (ref 150–400)
RBC: 4.3 MIL/uL (ref 4.22–5.81)
RDW: 13.8 % (ref 11.5–15.5)
WBC: 6.2 K/uL (ref 4.0–10.5)

## 2016-08-07 LAB — BASIC METABOLIC PANEL WITH GFR
Anion gap: 6 (ref 5–15)
BUN: 13 mg/dL (ref 6–20)
CO2: 29 mmol/L (ref 22–32)
Calcium: 8.9 mg/dL (ref 8.9–10.3)
Chloride: 106 mmol/L (ref 101–111)
Creatinine, Ser: 0.99 mg/dL (ref 0.61–1.24)
GFR calc Af Amer: >60 mL/min
GFR calc non Af Amer: >60 mL/min
Glucose, Bld: 98 mg/dL (ref 65–99)
Potassium: 4.2 mmol/L (ref 3.5–5.1)
Sodium: 141 mmol/L (ref 135–145)

## 2016-08-07 NOTE — ED Notes (Signed)
Pt tolerated dysphagia diet well, minimal drooling present, edp notified.

## 2016-08-07 NOTE — ED Notes (Signed)
Dysphagia dinner tray ALREADY ordered

## 2016-08-07 NOTE — Discharge Instructions (Signed)
Please schedule follow-up with GI for barium swallow study. Followed dysphagia diet guidelines listed in discharge paperwork, primarily pured food. Return to the ED if you experience severe worsening symptoms.

## 2016-08-07 NOTE — ED Triage Notes (Signed)
Son stated, My dad has Parkinson's disease and hes getting worse when it comes to eating and being able to swallow.  His food is getting stuck.  He is suppose to have a Barium swallow, but need to schedule that.

## 2016-08-07 NOTE — ED Notes (Signed)
Advised Pt and family that pt will temporarily be in a hallway bed. They voiced their understanding. Pt placed on Dinamap to monitor VS and given warm blanket. Pt in NAD and is smiling. Difficult to understand pt.

## 2016-08-07 NOTE — ED Provider Notes (Signed)
[facility]ER[facility] DEPT Provider Note   CSN: [id number] Arrival date & time: 08/07/16  1110     History   Chief Complaint Chief Complaint  Patient presents with  . Dysphagia    HPI Ernest MallickDavid A Manske is a 82 y.o. male with a past medical history of Parkinson's disease who presents to the ED today complaining of dysphagia. Per the family patient has had worsening difficulty with swallowing over the last week to 2 weeks. [place] and patient was getting "choked" on the food. He was unable to swallow [place] or pie. Patient saw his neurologist earlier this week who recommended a barium swallow study. Family states he was scheduled to have that next week but did not feel that he could wait. He has not had anything else to eat or drink in the last 24 hours as he doesn't feel like he could swallow it. He has equal difficulty with solids and liquids per family. Family also reports worsening gait disturbance and incontinence for the last 6-7 months. No slurred speech, sore throat, focal extremity weakness.  HPI  Past Medical History:  Diagnosis Date  . Depression with anxiety   . Hernia    right ing  . Parkinson disease (HCC)   . Recurrent upper respiratory infection (URI)    sinus infection week of 11/16/11     Patient Active Problem List   Diagnosis Date Noted  . Parkinson's disease (HCC) 06/14/2013    Past Surgical History:  Procedure Laterality Date  . INGUINAL HERNIA REPAIR  11/26/2011   Procedure: LAPAROSCOPIC INGUINAL HERNIA;  Surgeon: Atilano InaEric M Wilson, MD,FACS;  Location: [facility];  Service: General;  Laterality: Right;  Laparoscopic Right Inguinal Hernia Repair with Mesh       Home Medications    Prior to Admission medications   Medication Sig Start Date End Date Taking? Authorizing Provider  amoxicillin-clavulanate (AUGMENTIN) 875-125 MG tablet Take 1 tablet by mouth every 12 (twelve) hours. 07/22/16   Gwyneth SproutWhitney  Plunkett, MD  carbidopa-levodopa (SINEMET CR) 50-200 MG per tablet Take 1 tablet by mouth daily at 10 pm.    Historical Provider, MD  diazepam (VALIUM) 5 MG tablet Take 5-7.5 mg by mouth 2 (two) times daily as needed for anxiety.     Historical Provider, MD  DM-Doxylamine-Acetaminophen (NYQUIL COLD & FLU PO) Take 15 mLs by mouth at bedtime as needed (cough).    Historical Provider, MD  QUEtiapine (SEROQUEL) 50 MG tablet Take 50 mg by mouth 2 (two) times daily.    Historical Provider, MD  ranitidine (ZANTAC) 150 MG capsule Take 150 mg by mouth daily.    Historical Provider, MD    Family History Family History  Problem Relation Age of Onset  . Parkinson's disease Mother 1888  . Cancer Father   . Stroke Brother     Left Brain     Social History Social History  Substance Use Topics  . Smoking status: Never Smoker  . Smokeless tobacco: Never Used  . Alcohol use No     Allergies   Patient has no known allergies.   Review of Systems Review of Systems  All other systems reviewed and are negative.    Physical Exam Updated Vital Signs BP 115/70 (BP Location: Right Arm)   Pulse 64   Temp 97.8 F (36.6 C) (Oral)   Resp 17   SpO2 97%   Physical Exam  Constitutional: He is oriented to person, place,  and time. He appears well-developed and well-nourished. No distress.  HENT:  Head: Normocephalic and atraumatic.  Mouth/Throat: Oropharynx is clear and moist. No oropharyngeal exudate.  Eyes: Conjunctivae and EOM are normal. Pupils are equal, round, and reactive to light. Right eye exhibits no discharge. Left eye exhibits no discharge. No scleral icterus.  Neck: Neck supple. No tracheal deviation present. No thyromegaly present.  Cardiovascular: Normal rate, regular rhythm, normal heart sounds and intact distal pulses.  Exam reveals no gallop and no friction rub.   No murmur heard. Pulmonary/Chest: Effort normal and breath sounds normal. No respiratory distress. He has no wheezes. He  has no rales. He exhibits no tenderness.  Abdominal: Soft. He exhibits no distension. There is no tenderness. There is no guarding.  Musculoskeletal: Normal range of motion. He exhibits no edema.  Lymphadenopathy:    He has no cervical adenopathy.  Neurological: He is alert and oriented to person, place, and time. No cranial nerve deficit.  Skin: Skin is warm and dry. No rash noted. He is not diaphoretic. No erythema. No pallor.  Psychiatric: He has a normal mood and affect. His behavior is normal.  Nursing note and vitals reviewed.    ED Treatments / Results  Labs (all labs ordered are listed, but only abnormal results are displayed) Labs Reviewed  CBC WITH DIFFERENTIAL/PLATELET  BASIC METABOLIC PANEL    EKG  EKG Interpretation None       Radiology Dg Chest 2 View  Result Date: 08/07/2016 CLINICAL DATA:  dysphagia, got "choked" on food yesterday. evaluate for aspiration. poss stroke causing dysphagia EXAM: CHEST  2 VIEW COMPARISON:  Chest x-rays dated 07/22/2016 and 11/23/2011 FINDINGS: Heart size and mediastinal contours are normal. Lungs are clear. Lung volumes are normal. No pleural effusion or pneumothorax seen. Osseous and soft tissue structures about the chest are unremarkable. IMPRESSION: No active cardiopulmonary disease. No evidence of pneumonia or aspiration. Electronically Signed   By: Bary Richard M.D.   On: 08/07/2016 16:28   Ct Head Wo Contrast  Result Date: 08/07/2016 CLINICAL DATA:  Parkinson's disease. Choked on food. Evaluate for stroke causing dysphagia. EXAM: CT HEAD WITHOUT CONTRAST TECHNIQUE: Contiguous axial images were obtained from the base of the skull through the vertex without intravenous contrast. COMPARISON:  None. FINDINGS: Brain: No evidence for acute hemorrhage, mass lesion, midline shift, hydrocephalus or large infarct. There is mild age-appropriate atrophy. There is subtle low-density in the periventricular white matter suggesting chronic  changes. Vascular: No hyperdense vessel or unexpected calcification. Skull: No fracture. Sinuses/Orbits: Mild sinus disease in left maxillary sinus. Mastoid air cells are clear. Other: None. IMPRESSION: No acute intracranial abnormality. Evidence for chronic small vessel ischemic changes. Electronically Signed   By: Richarda Overlie M.D.   On: 08/07/2016 16:28    Procedures Procedures (including critical care time)  Medications Ordered in ED Medications - No data to display   Initial Impression / Assessment and Plan / ED Course  I have reviewed the triage vital signs and the nursing notes.  Pertinent labs & imaging results that were available during my care of the patient were reviewed by me and considered in my medical decision making (see chart for details).  Clinical Course     82 year old male with history of Parkinson's disease presents to the ED today brought in by family members with complaints of dysphagia. This has been progressively worsening over the last couple of weeks. However, yesterday family states that he got very choked on the Thanksgiving food and  he has not had anything to eat in the last 24 hours for fearful not being able to swallow properly. Family does not live with him and they are concerned. On presentation to ED, patient overall appears well. He is able to swallow his own secretions. Lungs are CTAB. Chest x-ray obtained to rule out aspiration and this was negative. Lab work unremarkable. CT head obtained to ensure that his dysphagia is not secondary to stroke which was also normal. Patient passed a swallow screen in the ED and was able to eat a complete dysphagia diet tray without choking or having difficulty swallowing. Family members state that they can puree his food at home. They currently have a referral to get a barium swallow study from their neurologist. They will schedule this as soon as possible. Suspect dysphagia secondary to motility dysfunction, possibly  achalasia. Follow-up is indicated in discharge paperwork. Return precautions outlined in patient discharge instructions.  Case discussed with Dr. Erma HeritageIsaacs who agrees with treatment plan.  Final Clinical Impressions(s) / ED Diagnoses   Final diagnoses:  None    New Prescriptions New Prescriptions   No medications on file     Dub MikesSamantha Tripp Lataja Newland, PA-C 08/08/16 1023    Shaune Pollackameron Isaacs, MD 08/08/16 1818

## 2016-08-11 ENCOUNTER — Other Ambulatory Visit: Payer: Self-pay | Admitting: Neurology

## 2016-08-11 DIAGNOSIS — R1319 Other dysphagia: Secondary | ICD-10-CM

## 2016-08-26 ENCOUNTER — Ambulatory Visit (HOSPITAL_COMMUNITY)
Admission: RE | Admit: 2016-08-26 | Discharge: 2016-08-26 | Disposition: A | Discharge status: Home / Self Care | Payer: Medicare Other | Source: Ambulatory Visit | Attending: Neurology | Admitting: Neurology

## 2016-08-26 DIAGNOSIS — R1310 Dysphagia, unspecified: Secondary | ICD-10-CM | POA: Diagnosis present

## 2016-08-26 DIAGNOSIS — G20 Parkinson's disease: Secondary | ICD-10-CM | POA: Diagnosis present

## 2016-08-26 DIAGNOSIS — R1319 Other dysphagia: Secondary | ICD-10-CM

## 2016-08-26 NOTE — Progress Notes (Signed)
Modified Barium Swallow Progress Note  Patient Details  Name: Randy Davenport MRN: [id number] Date of Birth: 10/02/1930  Today's Date: 08/26/2016  Modified Barium Swallow completed.  Full report located under Chart Review in the Imaging Section.  Brief recommendations include the following:  Clinical Impression  Mr Randy Davenport demonstrated delayed transit to posterior oral cavity. Pharyngeal dysphagia is severe marked by significantly weak and decreased reduced tongue base retraction, pharyngeal contraction, laryngeal elevation and epiglottic inversion. All consistencies were penetrated silently (frank) into laryngeal vestibule during the swallow, with puree minimal. Chin tuck position worsened penetration and resulted in aspiration. Mod -maximum vallecular and pyriform sinus residue in which 2-3 multiple swallows mildly reduced residue when verbally cued (poor sensation to residue). He was unable to produce effective cough or throat clear to keep laryngeal vestibule clear of penetrates. Pt will likely aspirate majority of intake. Discussed/educated with pt and pt's son results and options. The following was agreed upon during assessment: puree consistencies, thin liquids (least amount of residue), 2-3 multiple swallows, hard cough/throat clears as pt able, small volumes, stay upright 1 hour post meals. Educated that water is a safer choice than liquids with additives, sugar and preseveratives, etc. and encouraged water and continued movement/walking. Gave pt and son information about thicken Up in case he begins to cough freqeuntly and interupting quality medical stability. Recommend Home Health ST to assist with strategies and answer questions.       Swallow Evaluation Recommendations       SLP Diet Recommendations: Thin liquid;Dysphagia 1 (Puree) solids   Liquid Administration via: Cup   Medication Administration: Crushed with puree   Supervision: Patient able to self feed;Full  supervision/cueing for compensatory strategies   Compensations: Slow rate;Small sips/bites;Multiple dry swallows after each bite/sip;Hard cough after swallow   Postural Changes: Seated upright at 90 degrees;Remain semi-upright after after feeds/meals (Comment)   Oral Care Recommendations: Oral care BID        Randy Davenport, Randy Davenport 08/26/2016,4:10 PM  Randy Davenport M.Ed [employer] Pager [phone]

## 2016-09-04 ENCOUNTER — Encounter (HOSPITAL_COMMUNITY): Payer: Self-pay | Admitting: *Deleted

## 2016-09-04 DIAGNOSIS — R1310 Dysphagia, unspecified: Secondary | ICD-10-CM | POA: Diagnosis present

## 2016-09-04 DIAGNOSIS — G20 Parkinson's disease: Secondary | ICD-10-CM | POA: Diagnosis not present

## 2016-09-04 DIAGNOSIS — M545 Low back pain: Secondary | ICD-10-CM | POA: Diagnosis not present

## 2016-09-04 DIAGNOSIS — K5900 Constipation, unspecified: Secondary | ICD-10-CM | POA: Diagnosis not present

## 2016-09-04 NOTE — ED Triage Notes (Signed)
Pt states he is having difficulty swallowing. Pt has hx of Parkinson's disease and states he has been having difficulty swallowing for the past few months. Pt son states the patient appears to be at baseline. Pt states he tried to eat a hamburger today but was unable to finish it.

## 2016-09-05 ENCOUNTER — Emergency Department (HOSPITAL_COMMUNITY): Payer: Medicare Other

## 2016-09-05 ENCOUNTER — Emergency Department (HOSPITAL_COMMUNITY)
Admission: EM | Admit: 2016-09-05 | Discharge: 2016-09-05 | Disposition: A | Discharge status: Home / Self Care | Payer: Medicare Other | Attending: Emergency Medicine | Admitting: Emergency Medicine

## 2016-09-05 DIAGNOSIS — K5900 Constipation, unspecified: Secondary | ICD-10-CM

## 2016-09-05 DIAGNOSIS — M545 Low back pain, unspecified: Secondary | ICD-10-CM

## 2016-09-05 LAB — CBC WITH DIFFERENTIAL/PLATELET
Basophils Absolute: 0 10*3/uL (ref 0.0–0.1)
Basophils Relative: 0 %
EOS ABS: 0.2 10*3/uL (ref 0.0–0.7)
EOS PCT: 2 %
HCT: 37.6 % — ABNORMAL LOW (ref 39.0–52.0)
Hemoglobin: 12.4 g/dL — ABNORMAL LOW (ref 13.0–17.0)
LYMPHS ABS: 2.1 10*3/uL (ref 0.7–4.0)
Lymphocytes Relative: 28 %
MCH: 30.3 pg (ref 26.0–34.0)
MCHC: 33 g/dL (ref 30.0–36.0)
MCV: 91.9 fL (ref 78.0–100.0)
Monocytes Absolute: 0.6 10*3/uL (ref 0.1–1.0)
Monocytes Relative: 8 %
Neutro Abs: 4.6 10*3/uL (ref 1.7–7.7)
Neutrophils Relative %: 62 %
PLATELETS: 189 10*3/uL (ref 150–400)
RBC: 4.09 MIL/uL — AB (ref 4.22–5.81)
RDW: 13.9 % (ref 11.5–15.5)
WBC: 7.5 10*3/uL (ref 4.0–10.5)

## 2016-09-05 LAB — URINALYSIS, ROUTINE W REFLEX MICROSCOPIC
Bilirubin Urine: NEGATIVE
GLUCOSE, UA: NEGATIVE mg/dL
Hgb urine dipstick: NEGATIVE
Ketones, ur: 5 mg/dL — AB
LEUKOCYTES UA: NEGATIVE
Nitrite: NEGATIVE
PH: 5 (ref 5.0–8.0)
Protein, ur: NEGATIVE mg/dL
SPECIFIC GRAVITY, URINE: 1.024 (ref 1.005–1.030)

## 2016-09-05 LAB — COMPREHENSIVE METABOLIC PANEL
ALK PHOS: 53 U/L (ref 38–126)
AST: 19 U/L (ref 15–41)
Albumin: 3.5 g/dL (ref 3.5–5.0)
Anion gap: 6 (ref 5–15)
BILIRUBIN TOTAL: 0.7 mg/dL (ref 0.3–1.2)
BUN: 17 mg/dL (ref 6–20)
CALCIUM: 8.4 mg/dL — AB (ref 8.9–10.3)
CHLORIDE: 108 mmol/L (ref 101–111)
CO2: 27 mmol/L (ref 22–32)
CREATININE: 0.92 mg/dL (ref 0.61–1.24)
GFR calc Af Amer: >60 mL/min (ref >60)
Glucose, Bld: 124 mg/dL — ABNORMAL HIGH (ref 65–99)
Potassium: 3.9 mmol/L (ref 3.5–5.1)
Sodium: 141 mmol/L (ref 135–145)
TOTAL PROTEIN: 6.7 g/dL (ref 6.5–8.1)

## 2016-09-05 NOTE — Discharge Instructions (Signed)
You can take colace and miralax, available over the counter for constipation.  Take both medications twice daily until you have relief of your constipation.  Get rechecked immediately if you develop new or worrisome symptoms.

## 2016-09-05 NOTE — ED Notes (Signed)
Called Clovis RileyMitchell (son). He is on his way to pick his dad.

## 2016-09-05 NOTE — ED Notes (Signed)
Pt given apple juice. Will check back to see how pt tolerated.

## 2016-09-05 NOTE — ED Notes (Signed)
Pt made aware urine needed for UA. Unable to provide at this time.

## 2016-09-05 NOTE — ED Notes (Signed)
Pt's Son Clovis RileyMitchell: [phone]. To be contacted upon pts discharge.

## 2016-09-05 NOTE — ED Notes (Signed)
Bed: NW29WA22 Expected date:  Expected time:  Means of arrival:  Comments: No Bed.

## 2016-09-05 NOTE — ED Provider Notes (Signed)
[facility]-EMERGENCY DEPT Provider Note   CSN: [id number] Arrival date & time: 09/04/16  2043   By signing my name below, I, Clarisse Gouge, attest that this documentation has been prepared under the direction and in the presence of No att. providers found. Electronically signed, Clarisse Gouge, ED Scribe. 09/05/16. 8:49 AM.   History   Chief Complaint Chief Complaint  Patient presents with  . Dysphagia   LEVEL 5 CAVEAT DUE TO PT DISORIENTED  The history is provided by the patient. The history is limited by the condition of the patient. No language interpreter was used.  Per patient he is here for low back pain that's been ongoing for the last year. He reports midline pain is not worsened or alleviated by any activities. He denies any weakness or numbness. No fevers, dysuria, constipation, diarrhea. Per triage note and sign and he has had difficulty swallowing intermittently over the last year and he had difficulty finishing a hamburger today. There were no choking episodes. Patient does not recall this event and denies any chest pain or shortness of breath. Son states that his father does have memory problems at times. The patient does live with his son and a caregiver.    Past Medical History:  Diagnosis Date  . Depression with anxiety   . Hernia    right ing  . Parkinson disease (HCC)   . Recurrent upper respiratory infection (URI)    sinus infection week of 11/16/11     Patient Active Problem List   Diagnosis Date Noted  . Parkinson's disease (HCC) 06/14/2013    Past Surgical History:  Procedure Laterality Date  . INGUINAL HERNIA REPAIR  11/26/2011   Procedure: LAPAROSCOPIC INGUINAL HERNIA;  Surgeon: Atilano Ina, MD,FACS;  Location: [facility];  Service: General;  Laterality: Right;  Laparoscopic Right Inguinal Hernia Repair with Mesh       Home Medications    Prior to Admission medications   Medication Sig Start Date End Date Taking? Authorizing Provider    amoxicillin-clavulanate (AUGMENTIN) 875-125 MG tablet Take 1 tablet by mouth every 12 (twelve) hours. 07/22/16   Gwyneth Sprout, MD  carbidopa-levodopa (SINEMET CR) 50-200 MG per tablet Take 1 tablet by mouth daily at 10 pm.    Historical Provider, MD  diazepam (VALIUM) 5 MG tablet Take 5-7.5 mg by mouth 2 (two) times daily as needed for anxiety.     Historical Provider, MD  DM-Doxylamine-Acetaminophen (NYQUIL COLD & FLU PO) Take 15 mLs by mouth at bedtime as needed (cough).    Historical Provider, MD  QUEtiapine (SEROQUEL) 50 MG tablet Take 50 mg by mouth 2 (two) times daily.    Historical Provider, MD  ranitidine (ZANTAC) 150 MG capsule Take 150 mg by mouth daily.    Historical Provider, MD    Family History Family History  Problem Relation Age of Onset  . Parkinson's disease Mother 61  . Cancer Father   . Stroke Brother     Left Brain     Social History Social History  Substance Use Topics  . Smoking status: Never Smoker  . Smokeless tobacco: Never Used  . Alcohol use No     Allergies   Patient has no known allergies.   Review of Systems Review of Systems  Unable to perform ROS: Other (Pt confused and disoriented)   A complete 10 system review of systems was obtained and all systems are negative except as noted in the HPI and PMH.    Physical Exam Updated Vital  Signs BP 113/73 (BP Location: Left Arm)   Pulse 62   Temp 98.4 F (36.9 C) (Oral)   Resp 18   SpO2 92%   Physical Exam  Constitutional: He appears well-developed and well-nourished.  HENT:  Head: Normocephalic and atraumatic.  Cardiovascular: Normal rate and regular rhythm.   No murmur heard. Pulmonary/Chest: Effort normal and breath sounds normal. No respiratory distress.  Abdominal: Soft. There is no tenderness. There is no rebound and no guarding.  Musculoskeletal: He exhibits no edema or tenderness.  No ttp to back.  Neurological: He is alert.  Disoriented to time. 5 out of 5 strength in all 4  extremities, sensation light touch intact in all 4 extremities.  Skin: Skin is warm and dry.  Psychiatric: He has a normal mood and affect. His behavior is normal.  Nursing note and vitals reviewed.    ED Treatments / Results  DIAGNOSTIC STUDIES: Oxygen Saturation is 98% on RA, normal by my interpretation.    COORDINATION OF CARE: 8:49 AM Discussed treatment plan with pt at bedside and pt agreed to plan.  Labs (all labs ordered are listed, but only abnormal results are displayed) Labs Reviewed  URINALYSIS, ROUTINE W REFLEX MICROSCOPIC - Abnormal; Notable for the following:       Result Value   Ketones, ur 5 (*)    All other components within normal limits  COMPREHENSIVE METABOLIC PANEL - Abnormal; Notable for the following:    Glucose, Bld 124 (*)    Calcium 8.4 (*)    ALT <5 (*)    All other components within normal limits  CBC WITH DIFFERENTIAL/PLATELET - Abnormal; Notable for the following:    RBC 4.09 (*)    Hemoglobin 12.4 (*)    HCT 37.6 (*)    All other components within normal limits    EKG  EKG Interpretation None       Radiology Dg Chest 2 View  Result Date: 09/05/2016 CLINICAL DATA:  [age] year old male well difficulty swallowing. EXAM: CHEST  2 VIEW COMPARISON:  Chest radiograph dated 08/07/2016 FINDINGS: The lungs are clear. There is no pleural effusion or pneumothorax. Small rim linear radiopaque densities at the right lung base appears similar to the prior radiograph and likely calcified pleural plaques. The cardiac silhouette is within normal limits. No acute osseous pathology. IMPRESSION: No active cardiopulmonary disease. Electronically Signed   By: Elgie CollardArash  Radparvar M.D.   On: 09/05/2016 02:26   Dg Lumbar Spine Complete  Result Date: 09/05/2016 CLINICAL DATA:  82 year old male with dysphagia. No black complaints. EXAM: LUMBAR SPINE - COMPLETE 4+ VIEW COMPARISON:  None. FINDINGS: There is no acute fracture or subluxation of the lumbar spine. The vertebral  body heights and disc spaces are maintained. The posterior elements are intact. The neural foramina appear patent. There is mild lower lumbar facet hypertrophy. The soft tissues appear unremarkable. IMPRESSION: No acute lumbar spine pathology. Electronically Signed   By: Elgie CollardArash  Radparvar M.D.   On: 09/05/2016 02:29   Ct Renal Stone Study  Result Date: 09/05/2016 CLINICAL DATA:  82 year old male with history of Parkinson disease presenting with difficulty swallowing. Back pain. EXAM: CT ABDOMEN AND PELVIS WITHOUT CONTRAST TECHNIQUE: Multidetector CT imaging of the abdomen and pelvis was performed following the standard protocol without IV contrast. COMPARISON:  Lumbar spine radiograph dated 09/05/2016 FINDINGS: Evaluation of this exam is limited in the absence of intravenous contrast. Lower chest: Right lung base calcified pleural plaques. The visualized lung bases are otherwise clear. No intra-abdominal free  air or free fluid. Hepatobiliary: No focal liver abnormality is seen. No gallstones, gallbladder wall thickening, or biliary dilatation. Pancreas: Unremarkable. No pancreatic ductal dilatation or surrounding inflammatory changes. Spleen: Normal in size without focal abnormality. Adrenals/Urinary Tract: The adrenal glands appear unremarkable. Subcentimeter exophytic right renal upper pole hypodense lesion is not characterized but likely represents a cyst. A 3 mm faint nonobstructing right renal interpolar stone. No hydronephrosis. There is no hydronephrosis or nephrolithiasis on the left. The visualized ureters and urinary bladder appear unremarkable. Stomach/Bowel: Large amount of stool noted within the colon. Nondistended fluid-filled loops of small bowel, likely physiologic. Enteritis is less likely. Clinical correlation is recommended. There is no evidence of bowel obstruction or active inflammation. Normal appendix. Vascular/Lymphatic: Mild aortoiliac atherosclerotic disease. The abdominal aorta and IVC  are otherwise grossly unremarkable on this noncontrast study. No portal venous gas identified. There is no adenopathy. Reproductive: Mild enlargement of the prostate gland measuring 5.5 cm in transverse diameter. The seminal vesicles are grossly unremarkable. Other: There is a small fat containing left inguinal hernia. No associated inflammatory changes or fluid collection. Musculoskeletal: Multilevel degenerative changes of the spine with Schmorl nodes. No acute fracture for dislocation. IMPRESSION: No acute intra-abdominopelvic pathology.  A A faint 3 mm nonobstructing right renal stone.  No hydronephrosis. Constipation.  No bowel obstruction.  Normal appendix. Nondistended fluid-filled small bowel likely physiologic. Enteritis is less likely. Clinical correlation is recommended. Electronically Signed   By: Elgie CollardArash  Radparvar M.D.   On: 09/05/2016 04:06    Procedures Procedures (including critical care time)  Medications Ordered in ED Medications - No data to display   Initial Impression / Assessment and Plan / ED Course  I have reviewed the triage vital signs and the nursing notes.  Pertinent labs & imaging results that were available during my care of the patient were reviewed by me and considered in my medical decision making (see chart for details).  Clinical Course     Patient with history of Parkinson's disease here with low back pain and difficulty swallowing. In terms of his back pain this is not reproducible on examination and he does not appear to be in any distress in the emergency department. There is no evidence of UTI, stone, AAA. Presentation is not consistent with cauda equina or epidural abscess. CT scan does demonstrate some element of constipation, we will treat at home. Terms of dysphasia this is a chronic problem. He can tolerate oral in the emergency department without difficulty.  No evidence of esophageal impaction. Plan to DC home with outpatient follow-up.  Final  Clinical Impressions(s) / ED Diagnoses   Final diagnoses:  Midline low back pain without sciatica, unspecified chronicity  Constipation, unspecified constipation type    New Prescriptions Discharge Medication List as of 09/05/2016  4:32 AM    I personally performed the services described in this documentation, which was scribed in my presence. The recorded information has been reviewed and is accurate.    Tilden FossaElizabeth Kimm Sider, MD 09/05/16 2300

## 2017-03-26 ENCOUNTER — Encounter (HOSPITAL_COMMUNITY): Payer: Self-pay | Admitting: Emergency Medicine

## 2017-03-26 ENCOUNTER — Emergency Department (HOSPITAL_COMMUNITY)
Admission: EM | Admit: 2017-03-26 | Discharge: 2017-03-26 | Disposition: A | Discharge status: Home / Self Care | Payer: Medicare Other | Attending: Emergency Medicine | Admitting: Emergency Medicine

## 2017-03-26 ENCOUNTER — Emergency Department (HOSPITAL_COMMUNITY): Payer: Medicare Other

## 2017-03-26 DIAGNOSIS — R0989 Other specified symptoms and signs involving the circulatory and respiratory systems: Secondary | ICD-10-CM | POA: Insufficient documentation

## 2017-03-26 DIAGNOSIS — G8929 Other chronic pain: Secondary | ICD-10-CM | POA: Diagnosis not present

## 2017-03-26 DIAGNOSIS — R42 Dizziness and giddiness: Secondary | ICD-10-CM | POA: Diagnosis not present

## 2017-03-26 DIAGNOSIS — G20 Parkinson's disease: Secondary | ICD-10-CM | POA: Diagnosis not present

## 2017-03-26 DIAGNOSIS — R531 Weakness: Secondary | ICD-10-CM | POA: Diagnosis not present

## 2017-03-26 DIAGNOSIS — R202 Paresthesia of skin: Secondary | ICD-10-CM | POA: Diagnosis present

## 2017-03-26 DIAGNOSIS — R05 Cough: Secondary | ICD-10-CM | POA: Diagnosis not present

## 2017-03-26 DIAGNOSIS — M545 Low back pain: Secondary | ICD-10-CM | POA: Diagnosis not present

## 2017-03-26 DIAGNOSIS — R059 Cough, unspecified: Secondary | ICD-10-CM

## 2017-03-26 LAB — CBC WITH DIFFERENTIAL/PLATELET
BASOS PCT: 0 %
Basophils Absolute: 0 10*3/uL (ref 0.0–0.1)
Eosinophils Absolute: 0.1 10*3/uL (ref 0.0–0.7)
Eosinophils Relative: 1 %
HEMATOCRIT: 40 % (ref 39.0–52.0)
Hemoglobin: 13.5 g/dL (ref 13.0–17.0)
LYMPHS PCT: 15 %
Lymphs Abs: 1.6 10*3/uL (ref 0.7–4.0)
MCH: 30.1 pg (ref 26.0–34.0)
MCHC: 33.8 g/dL (ref 30.0–36.0)
MCV: 89.1 fL (ref 78.0–100.0)
MONO ABS: 0.8 10*3/uL (ref 0.1–1.0)
MONOS PCT: 8 %
NEUTROS ABS: 8.2 10*3/uL — AB (ref 1.7–7.7)
Neutrophils Relative %: 76 %
Platelets: 190 10*3/uL (ref 150–400)
RBC: 4.49 MIL/uL (ref 4.22–5.81)
RDW: 14.4 % (ref 11.5–15.5)
WBC: 10.8 10*3/uL — ABNORMAL HIGH (ref 4.0–10.5)

## 2017-03-26 LAB — BRAIN NATRIURETIC PEPTIDE: B NATRIURETIC PEPTIDE 5: 31.6 pg/mL (ref 0.0–100.0)

## 2017-03-26 LAB — TROPONIN I: Troponin I: <0.03 ng/mL (ref <0.03)

## 2017-03-26 LAB — COMPREHENSIVE METABOLIC PANEL
ALT: 11 U/L — AB (ref 17–63)
AST: 18 U/L (ref 15–41)
Albumin: 3.8 g/dL (ref 3.5–5.0)
Alkaline Phosphatase: 63 U/L (ref 38–126)
Anion gap: 8 (ref 5–15)
BUN: 22 mg/dL — AB (ref 6–20)
CALCIUM: 9.4 mg/dL (ref 8.9–10.3)
CHLORIDE: 105 mmol/L (ref 101–111)
CO2: 28 mmol/L (ref 22–32)
CREATININE: 1.08 mg/dL (ref 0.61–1.24)
GFR calc Af Amer: >60 mL/min (ref >60)
Glucose, Bld: 124 mg/dL — ABNORMAL HIGH (ref 65–99)
Potassium: 4 mmol/L (ref 3.5–5.1)
Sodium: 141 mmol/L (ref 135–145)
Total Bilirubin: 0.8 mg/dL (ref 0.3–1.2)
Total Protein: 8.1 g/dL (ref 6.5–8.1)

## 2017-03-26 MED ORDER — SODIUM CHLORIDE 0.9 % IV BOLUS (SEPSIS)
1000.0000 mL | Freq: Once | INTRAVENOUS | Status: AC
Start: 2017-03-26 — End: 2017-03-26
  Administered 2017-03-26: 1000 mL via INTRAVENOUS

## 2017-03-26 MED ORDER — GUAIFENESIN 100 MG/5ML PO LIQD: 100.0000 mg | ORAL | 0 refills | Status: AC | PRN

## 2017-03-26 NOTE — ED Triage Notes (Addendum)
Family reports pt has had bilateral leg numbness and tingling for the past few weeks. Denies any pain or swelling. No redness or swelling noted to bilateral legs.  Family also reports pt has been coughing up a lot of mucus. Pt would not speak during triage, but would nod/point.

## 2017-03-26 NOTE — ED Provider Notes (Signed)
[facility]ER[facility] DEPT Provider Note   CSN: [id number] Arrival date & time: 03/26/17  1506     History   Chief Complaint Chief Complaint  Patient presents with  . Tingling    HPI Ernest MallickDavid A Acres is a 83 y.o. male.  HPI  Patient presents with his caregiver who provide the history of present illness. Patient has Parkinson's disease, has baseline aphasia, level V caveat. The patient has been cared for by his caregiver for a long time. She notes that over the past week he has developed cough, increasing weakness. He also has persistent low back pain, but no new back pain, nor new weakness in his extremities. He does have baseline weakness in all extremities as well as tremor. Patient answers questions with nodding, brief verbal responses, but otherwise cannot describe any details of his history of present illness. Caregiver denies fever, vomiting, fall in the past 2 or 3 days. [facility]  He seems to deny any recent medication changes.   Past Medical History:  Diagnosis Date  . Depression with anxiety   . Hernia    right ing  . Parkinson disease (HCC)   . Recurrent upper respiratory infection (URI)    sinus infection week of 11/16/11     Patient Active Problem List   Diagnosis Date Noted  . Parkinson's disease (HCC) 06/14/2013    Past Surgical History:  Procedure Laterality Date  . INGUINAL HERNIA REPAIR  11/26/2011   Procedure: LAPAROSCOPIC INGUINAL HERNIA;  Surgeon: Atilano InaEric M Wilson, MD,FACS;  Location: [facility];  Service: General;  Laterality: Right;  Laparoscopic Right Inguinal Hernia Repair with Mesh       Home Medications    Prior to Admission medications   Medication Sig Start Date End Date Taking? Authorizing Provider  diazepam (VALIUM) 5 MG tablet Take 5-7.5 mg (1 to 1.5 tablets) up to twice daily, as needed for anxiety 02/22/17  Yes [provider]  QUEtiapine (SEROQUEL) 50 MG tablet Take by mouth. 01/19/17 01/19/18 Yes  [provider]  amoxicillin-clavulanate (AUGMENTIN) 875-125 MG tablet Take 1 tablet by mouth every 12 (twelve) hours. 07/22/16   Gwyneth SproutPlunkett, Whitney, MD  carbidopa-levodopa (SINEMET CR) 50-200 MG per tablet Take 1 tablet by mouth daily at 10 pm.    [provider]  diazepam (VALIUM) 5 MG tablet Take 5-7.5 mg by mouth 2 (two) times daily as needed for anxiety.     [provider]  DM-Doxylamine-Acetaminophen (NYQUIL COLD & FLU PO) Take 15 mLs by mouth at bedtime as needed (cough).    [provider]  QUEtiapine (SEROQUEL) 50 MG tablet Take 50 mg by mouth 2 (two) times daily.    [provider]  ranitidine (ZANTAC) 150 MG capsule Take 150 mg by mouth daily.    [provider]    Family History Family History  Problem Relation Age of Onset  . Parkinson's disease Mother 7888  . Cancer Father   . Stroke Brother        Left Brain     Social History Social History  Substance Use Topics  . Smoking status: Never Smoker  . Smokeless tobacco: Never Used  . Alcohol use No     Allergies   Patient has no known allergies.   Review of Systems Review of Systems  Constitutional:       Per HPI, otherwise negative  HENT:       Per HPI, otherwise negative  Respiratory:  Per HPI, otherwise negative  Cardiovascular:       Per HPI, otherwise negative  Gastrointestinal: Negative for vomiting.  Endocrine:       Negative aside from HPI  Genitourinary:       Neg aside from HPI   Musculoskeletal:       Per HPI, otherwise negative  Skin: Negative.   Neurological: Positive for speech difficulty, weakness and light-headedness. Negative for syncope.     Physical Exam Updated Vital Signs BP 117/72 (BP Location: Right Arm)   Pulse 72   Temp 98.6 F (37 C) (Oral)   Resp 18   SpO2 99%   Physical Exam  Constitutional: He is oriented to person, place, and time. He appears cachectic. He has a sickly appearance. No distress.  Sickly  appearing frail elderly male awake, alert, mild tremor  HENT:  Head: Normocephalic and atraumatic.  Eyes: Conjunctivae and EOM are normal.  Cardiovascular: Normal rate and regular rhythm.   Pulmonary/Chest: No stridor. He has decreased breath sounds.  Abdominal: He exhibits no distension.  Musculoskeletal: He exhibits no edema.  Neurological: He is alert and oriented to person, place, and time. He displays atrophy and tremor. He exhibits abnormal muscle tone. He displays no seizure activity.  Aphasia  Skin: Skin is warm and dry.  Psychiatric: His speech is delayed. He is slowed and withdrawn.  Nursing note and vitals reviewed.    ED Treatments / Results  Labs (all labs ordered are listed, but only abnormal results are displayed) Labs Reviewed  CBC WITH DIFFERENTIAL/PLATELET - Abnormal; Notable for the following:       Result Value   WBC 10.8 (*)    Neutro Abs 8.2 (*)    All other components within normal limits  COMPREHENSIVE METABOLIC PANEL  BRAIN NATRIURETIC PEPTIDE  TROPONIN I  URINALYSIS, ROUTINE W REFLEX MICROSCOPIC     Radiology Dg Chest 2 View  Result Date: 03/26/2017 CLINICAL DATA:  Increasing weakness.  Cough.  Parkinson's disease. EXAM: CHEST  2 VIEW COMPARISON:  Radiograph 09/05/2016 FINDINGS: Stable lung volumes with borderline hyperinflation. Diaphragmatic calcifications on the right are unchanged. Normal heart size and mediastinal contours. No pulmonary edema, consolidation or pleural effusion. No pneumothorax. No acute osseous abnormality. IMPRESSION: No acute abnormality or change from prior exam. Electronically Signed   By: Rubye Oaks M.D.   On: 03/26/2017 17:31    Procedures Procedures (including critical care time)  Medications Ordered in ED Medications  sodium chloride 0.9 % bolus 1,000 mL (1,000 mLs Intravenous New Bag/Given 03/26/17 1736)     Initial Impression / Assessment and Plan / ED Course  I have reviewed the triage vital signs and the  nursing notes.  Pertinent labs & imaging results that were available during my care of the patient were reviewed by me and considered in my medical decision making (see chart for details).  Chart review notable for history of Parkinson's disease including baseline aphasia, diminished capacity for oral intake secondary to swallowing difficulty. Patient also known to have chronic low back pain.    8:31 PM On repeat exam the patient is awake and alert, using his walker to ambulate in the emergency department. He appears in no distress, is hemodynamically stable.  Patient is requesting discharge. The patient has no urinalysis, he has no urinary complaints, and prefers to leave prior to completing this evaluation. Given the otherwise generally reassuring findings, no evidence for pneumonia, no evidence for bacteremia or sepsis, acknowledging the patient's baseline chronic illness, the  patient was discharged, per request with outpatient follow-up. Per request the patient was provided cough medication.  Final Clinical Impressions(s) / ED Diagnoses   Final diagnoses:  Cough  Weakness    New Prescriptions New Prescriptions   GUAIFENESIN (ROBITUSSIN) 100 MG/5ML LIQUID    Take 5-10 mLs (100-200 mg total) by mouth every 4 (four) hours as needed for cough.     Gerhard Munch, MD 03/26/17 2032

## 2017-03-26 NOTE — ED Notes (Signed)
Patient made aware a urine sample is needed.  

## 2017-03-26 NOTE — ED Notes (Signed)
Patient made aware that he needs to give urine specimen but patient had been trying and was unable to go. Patient also let us know he wanted his IV out but Clinical research associatewriter stated that he did not need to take his IV out yet because he had not been discharged. Patient went ahead and took his own IV out. RN aware.

## 2017-03-26 NOTE — ED Notes (Signed)
Pt is aware a urine sample is needed. Urinal at bedside.  

## 2017-03-26 NOTE — Discharge Instructions (Signed)
As discussed, your evaluation today has been largely reassuring.  But, it is important that you monitor your condition carefully, and do not hesitate to return to the ED if you develop new, or concerning changes in your condition. ? ?Otherwise, please follow-up with your physician for appropriate ongoing care. ? ?

## 2017-03-26 NOTE — ED Notes (Signed)
RN stated to Clinical research associatewriter that she is going to start IV and will collect blood at that time for lab orders.

## 2017-07-28 ENCOUNTER — Emergency Department (HOSPITAL_COMMUNITY): Payer: Medicare Other

## 2017-07-28 ENCOUNTER — Encounter (HOSPITAL_COMMUNITY): Payer: Self-pay

## 2017-07-28 ENCOUNTER — Emergency Department (HOSPITAL_COMMUNITY)
Admission: EM | Admit: 2017-07-28 | Discharge: 2017-07-28 | Disposition: A | Discharge status: Home / Self Care | Payer: Medicare Other | Attending: Emergency Medicine | Admitting: Emergency Medicine

## 2017-07-28 DIAGNOSIS — Y92003 Bedroom of unspecified non-institutional (private) residence as the place of occurrence of the external cause: Secondary | ICD-10-CM | POA: Insufficient documentation

## 2017-07-28 DIAGNOSIS — Z23 Encounter for immunization: Secondary | ICD-10-CM | POA: Insufficient documentation

## 2017-07-28 DIAGNOSIS — Y999 Unspecified external cause status: Secondary | ICD-10-CM | POA: Insufficient documentation

## 2017-07-28 DIAGNOSIS — S0990XA Unspecified injury of head, initial encounter: Secondary | ICD-10-CM | POA: Diagnosis present

## 2017-07-28 DIAGNOSIS — Y9389 Activity, other specified: Secondary | ICD-10-CM | POA: Insufficient documentation

## 2017-07-28 DIAGNOSIS — D72829 Elevated white blood cell count, unspecified: Secondary | ICD-10-CM | POA: Insufficient documentation

## 2017-07-28 DIAGNOSIS — F0390 Unspecified dementia without behavioral disturbance: Secondary | ICD-10-CM | POA: Insufficient documentation

## 2017-07-28 DIAGNOSIS — W134XXA Fall from, out of or through window, initial encounter: Secondary | ICD-10-CM | POA: Diagnosis not present

## 2017-07-28 DIAGNOSIS — W19XXXA Unspecified fall, initial encounter: Secondary | ICD-10-CM

## 2017-07-28 DIAGNOSIS — Z79899 Other long term (current) drug therapy: Secondary | ICD-10-CM | POA: Insufficient documentation

## 2017-07-28 DIAGNOSIS — G20 Parkinson's disease: Secondary | ICD-10-CM | POA: Insufficient documentation

## 2017-07-28 DIAGNOSIS — S01112A Laceration without foreign body of left eyelid and periocular area, initial encounter: Secondary | ICD-10-CM | POA: Diagnosis not present

## 2017-07-28 DIAGNOSIS — S0181XA Laceration without foreign body of other part of head, initial encounter: Secondary | ICD-10-CM

## 2017-07-28 LAB — CBC WITH DIFFERENTIAL/PLATELET
BASOS PCT: 0 %
Basophils Absolute: 0 10*3/uL (ref 0.0–0.1)
EOS ABS: 0 10*3/uL (ref 0.0–0.7)
EOS PCT: 0 %
HCT: 34.4 % — ABNORMAL LOW (ref 39.0–52.0)
HEMOGLOBIN: 11.2 g/dL — AB (ref 13.0–17.0)
Lymphocytes Relative: 4 %
Lymphs Abs: 0.6 10*3/uL — ABNORMAL LOW (ref 0.7–4.0)
MCH: 29.8 pg (ref 26.0–34.0)
MCHC: 32.6 g/dL (ref 30.0–36.0)
MCV: 91.5 fL (ref 78.0–100.0)
MONOS PCT: 4 %
Monocytes Absolute: 0.6 10*3/uL (ref 0.1–1.0)
Neutro Abs: 15 10*3/uL — ABNORMAL HIGH (ref 1.7–7.7)
Neutrophils Relative %: 92 %
PLATELETS: 240 10*3/uL (ref 150–400)
RBC: 3.76 MIL/uL — ABNORMAL LOW (ref 4.22–5.81)
RDW: 15.1 % (ref 11.5–15.5)
WBC: 16.2 10*3/uL — AB (ref 4.0–10.5)

## 2017-07-28 LAB — I-STAT CHEM 8, ED
BUN: 41 mg/dL — AB (ref 6–20)
CALCIUM ION: 1.19 mmol/L (ref 1.15–1.40)
Chloride: 108 mmol/L (ref 101–111)
Creatinine, Ser: 1.1 mg/dL (ref 0.61–1.24)
Glucose, Bld: 150 mg/dL — ABNORMAL HIGH (ref 65–99)
HEMATOCRIT: 33 % — AB (ref 39.0–52.0)
HEMOGLOBIN: 11.2 g/dL — AB (ref 13.0–17.0)
Potassium: 4.2 mmol/L (ref 3.5–5.1)
SODIUM: 144 mmol/L (ref 135–145)
TCO2: 30 mmol/L (ref 22–32)

## 2017-07-28 MED ORDER — LIDOCAINE-EPINEPHRINE-TETRACAINE (LET) SOLUTION
3.0000 mL | Freq: Once | NASAL | Status: AC
  Administered 2017-07-28: 04:00:00 3 mL via TOPICAL
  Filled 2017-07-28: qty 3

## 2017-07-28 MED ORDER — IOPAMIDOL (ISOVUE-300) INJECTION 61%
INTRAVENOUS | Status: AC
  Administered 2017-07-28: 100 mL via INTRAVENOUS
  Filled 2017-07-28: qty 100

## 2017-07-28 MED ORDER — TETANUS-DIPHTH-ACELL PERTUSSIS 5-2.5-18.5 LF-MCG/0.5 IM SUSP
0.5000 mL | Freq: Once | INTRAMUSCULAR | Status: AC
  Administered 2017-07-28: 0.5 mL via INTRAMUSCULAR
  Filled 2017-07-28: qty 0.5

## 2017-07-28 MED ORDER — IOPAMIDOL (ISOVUE-300) INJECTION 61%
100.0000 mL | Freq: Once | INTRAVENOUS | Status: AC | PRN
  Administered 2017-07-28: 100 mL via INTRAVENOUS

## 2017-07-28 NOTE — ED Notes (Signed)
[facility] is here to transport patient at this time.

## 2017-07-28 NOTE — ED Provider Notes (Addendum)
[facility]-EMERGENCY DEPT Provider Note   CSN: [id number] Arrival date & time: 07/28/17  0142     History   Chief Complaint Chief Complaint  Patient presents with  . Fall    HPI Randy Davenport is a 83 y.o. male.  The history is provided by the EMS personnel. The history is limited by the condition of the patient (dementia).  Fall  This is a new problem. The current episode started less than 1 hour ago. The problem occurs constantly. The problem has not changed since onset.Pertinent negatives include no chest pain. Nothing aggravates the symptoms. Nothing relieves the symptoms. He has tried nothing for the symptoms. The treatment provided no relief.    Past Medical History:  Diagnosis Date  . Depression with anxiety   . Hernia    right ing  . Parkinson disease (HCC)   . Recurrent upper respiratory infection (URI)    sinus infection week of 11/16/11     Patient Active Problem List   Diagnosis Date Noted  . Parkinson's disease (HCC) 06/14/2013    No past surgical history on file.     Home Medications    Prior to Admission medications   Medication Sig Start Date End Date Taking? Authorizing Provider  amoxicillin-clavulanate (AUGMENTIN) 875-125 MG tablet Take 1 tablet by mouth every 12 (twelve) hours. Patient not taking: Reported on 03/26/2017 07/22/16   Gwyneth Sprout, MD  carbidopa-levodopa (SINEMET CR) 50-200 MG per tablet Take 1 tablet by mouth daily at 10 pm.    [provider]  diazepam (VALIUM) 5 MG tablet Take 5-7.5 mg (1 to 1.5 tablets) up to twice daily, as needed for anxiety 02/22/17   [provider]  DM-Doxylamine-Acetaminophen (NYQUIL COLD & FLU PO) Take 15 mLs by mouth at bedtime as needed (cough).    [provider]  guaiFENesin (ROBITUSSIN) 100 MG/5ML liquid Take 5-10 mLs (100-200 mg total) by mouth every 4 (four) hours as needed for cough. 03/26/17   Gerhard Munch, MD  QUEtiapine (SEROQUEL) 50 MG  tablet Take 50 mg by mouth 2 (two) times daily.    [provider]  ranitidine (ZANTAC) 150 MG capsule Take 150 mg by mouth daily.    [provider]    Family History Family History  Problem Relation Age of Onset  . Parkinson's disease Mother 36  . Cancer Father   . Stroke Brother        Left Brain     Social History Social History   Tobacco Use  . Smoking status: Never Smoker  . Smokeless tobacco: Never Used  Substance Use Topics  . Alcohol use: No  . Drug use: No     Allergies   Patient has no known allergies.   Review of Systems Review of Systems  Unable to perform ROS: Dementia  Cardiovascular: Negative for chest pain.  Skin: Positive for wound.     Physical Exam Updated Vital Signs BP 112/70 (BP Location: Left Arm)   Pulse 68   Temp 98.2 F (36.8 C) (Oral)   Resp (!) 22   SpO2 100%   Physical Exam  Constitutional: He appears well-developed and well-nourished. No distress.  HENT:  Head: Normocephalic.    Mouth/Throat: No oropharyngeal exudate.  Eyes: Conjunctivae are normal. Pupils are equal, round, and reactive to light.  Neck: Normal range of motion. Neck supple.  Cardiovascular: Normal rate, regular rhythm, normal heart sounds and intact distal pulses.  Pulmonary/Chest: Effort normal and breath sounds normal.  No stridor. He has no wheezes. He has no rales.  Abdominal: Soft. Bowel sounds are normal. He exhibits no mass. There is no tenderness. There is no rebound and no guarding.  Musculoskeletal: Normal range of motion. He exhibits no tenderness or deformity.  Neurological: He is alert. He displays normal reflexes.  Skin: Skin is warm and dry. Capillary refill takes less than 2 seconds.  Psychiatric: He has a normal mood and affect.     ED Treatments / Results  Labs (all labs ordered are listed, but only abnormal results are displayed)  Results for orders placed or performed during the hospital encounter of 07/28/17  CBC  with Differential/Platelet  Result Value Ref Range   WBC 16.2 (H) 4.0 - 10.5 K/uL   RBC 3.76 (L) 4.22 - 5.81 MIL/uL   Hemoglobin 11.2 (L) 13.0 - 17.0 g/dL   HCT 81.1 (L) 91.4 - 78.2 %   MCV 91.5 78.0 - 100.0 fL   MCH 29.8 26.0 - 34.0 pg   MCHC 32.6 30.0 - 36.0 g/dL   RDW 95.6 21.3 - 08.6 %   Platelets 240 150 - 400 K/uL   Neutrophils Relative % 92 %   Neutro Abs 15.0 (H) 1.7 - 7.7 K/uL   Lymphocytes Relative 4 %   Lymphs Abs 0.6 (L) 0.7 - 4.0 K/uL   Monocytes Relative 4 %   Monocytes Absolute 0.6 0.1 - 1.0 K/uL   Eosinophils Relative 0 %   Eosinophils Absolute 0.0 0.0 - 0.7 K/uL   Basophils Relative 0 %   Basophils Absolute 0.0 0.0 - 0.1 K/uL  I-stat chem 8, ed  Result Value Ref Range   Sodium 144 135 - 145 mmol/L   Potassium 4.2 3.5 - 5.1 mmol/L   Chloride 108 101 - 111 mmol/L   BUN 41 (H) 6 - 20 mg/dL   Creatinine, Ser 5.78 0.61 - 1.24 mg/dL   Glucose, Bld 469 (H) 65 - 99 mg/dL   Calcium, Ion 6.29 5.28 - 1.40 mmol/L   TCO2 30 22 - 32 mmol/L   Hemoglobin 11.2 (L) 13.0 - 17.0 g/dL   HCT 41.3 (L) 24.4 - 01.0 %   Ct Head Wo Contrast  Result Date: 07/28/2017 CLINICAL DATA:  83 year old male found on the floor with laceration above left eye. EXAM: CT HEAD WITHOUT CONTRAST CT CERVICAL SPINE WITHOUT CONTRAST TECHNIQUE: Multidetector CT imaging of the head and cervical spine was performed following the standard protocol without intravenous contrast. Multiplanar CT image reconstructions of the cervical spine were also generated. COMPARISON:  Head CT dated 08/07/2016 FINDINGS: CT HEAD FINDINGS Brain: There is mild age-related atrophy and chronic microvascular ischemic changes. No acute intracranial hemorrhage. No mass effect or midline shift. No extra-axial fluid collection. Vascular: No hyperdense vessel or unexpected calcification. Skull: Normal. Negative for fracture or focal lesion. Sinuses/Orbits: No acute finding. Other: Small left forehead laceration and hematoma. CT CERVICAL SPINE  FINDINGS Alignment: No acute subluxation. Skull base and vertebrae: No acute fracture. No primary bone lesion or focal pathologic process. Soft tissues and spinal canal: No prevertebral fluid or swelling. No visible canal hematoma. Disc levels: Multilevel degenerative changes with endplate irregularity and disc space narrowing. Upper chest: Biapical pleural plaques and scarring. There are multiple subpleural nodular densities in the right upper lobe measuring up to 6 mm. There is a 3 mm ground-glass nodule in the left upper lobe (series 7, image 73). Please see CT of the chest for better evaluation of the lungs. Other: Loss of  subcutaneous fat and cachexia. IMPRESSION: 1. No acute intracranial hemorrhage. 2. Age-related atrophy chronic microvascular ischemic changes. 3. No acute/traumatic cervical spine pathology. Multilevel degenerative changes. 4. Areas of subpleural scarring versus nodule in the right upper lobe. Referred to the chest CT report for better evaluation of the lungs. Electronically Signed   By: Elgie Collard M.D.   On: 07/28/2017 04:05   Ct Chest W Contrast  Result Date: 07/28/2017 CLINICAL DATA:  Found outside on brick patio, after climbing out of bedroom window. Leukocytosis. EXAM: CT CHEST, ABDOMEN, AND PELVIS WITH CONTRAST TECHNIQUE: Multidetector CT imaging of the chest, abdomen and pelvis was performed following the standard protocol during bolus administration of intravenous contrast. CONTRAST:  100 mL of Isovue 300 IV contrast COMPARISON:  Chest radiograph performed 03/26/2017 FINDINGS: CT CHEST FINDINGS Cardiovascular: The heart is normal in size. Scattered coronary artery calcifications are seen. Mild calcification is noted at the aortic arch. The great vessels are grossly unremarkable. Mediastinum/Nodes: A 1.3 cm subcarinal node is noted. No additional mediastinal lymphadenopathy is seen. No pericardial effusion is identified. The thyroid gland is unremarkable. No axillary  lymphadenopathy is seen. Lungs/Pleura: Scattered bilateral airspace opacities are noted, most prominent at the right upper and lower lobes, concerning for multifocal pneumonia. No pleural effusion or pneumothorax is seen. No dominant mass is identified. Prominent calcified pleural plaques are noted at the right lung base. Musculoskeletal: No acute osseous abnormalities are identified. The visualized musculature is unremarkable in appearance. CT ABDOMEN PELVIS FINDINGS Hepatobiliary: The liver is unremarkable in appearance. The gallbladder is unremarkable in appearance. The common bile duct remains normal in caliber. Pancreas: The pancreas is within normal limits. Spleen: The spleen is unremarkable in appearance. Adrenals/Urinary Tract: The adrenal glands are unremarkable in appearance. The kidneys are grossly unremarkable. There is no evidence of hydronephrosis. No renal or ureteral stones are identified. No perinephric stranding is appreciated. Stomach/Bowel: The stomach is unremarkable in appearance. The small bowel is within normal limits. The appendix is normal in caliber, without evidence of appendicitis. The colon is unremarkable in appearance. Vascular/Lymphatic: Scattered calcification is seen along the abdominal aorta and its branches. The abdominal aorta is otherwise grossly unremarkable. The inferior vena cava is grossly unremarkable. No retroperitoneal lymphadenopathy is seen. No pelvic sidewall lymphadenopathy is identified. Reproductive: The bladder is mildly distended and grossly unremarkable. The prostate is enlarged, measuring 5.3 cm in transverse dimension. Other: No additional soft tissue abnormalities are seen. Musculoskeletal: No acute osseous abnormalities are identified. The visualized musculature is unremarkable in appearance. IMPRESSION: 1. No evidence of traumatic injury to the chest, abdomen or pelvis. 2. Scattered bilateral airspace opacities, most prominent at the right upper and lower  lobes, concerning for multifocal pneumonia. 3. 1.3 cm subcarinal node may reflect the underlying infection. 4. Scattered coronary artery calcifications seen. 5.  Aortic Atherosclerosis (ICD10-I70.0). 6. Enlarged prostate noted. Electronically Signed   By: Roanna Raider M.D.   On: 07/28/2017 03:54   Ct Cervical Spine Wo Contrast  Result Date: 07/28/2017 CLINICAL DATA:  83 year old male found on the floor with laceration above left eye. EXAM: CT HEAD WITHOUT CONTRAST CT CERVICAL SPINE WITHOUT CONTRAST TECHNIQUE: Multidetector CT imaging of the head and cervical spine was performed following the standard protocol without intravenous contrast. Multiplanar CT image reconstructions of the cervical spine were also generated. COMPARISON:  Head CT dated 08/07/2016 FINDINGS: CT HEAD FINDINGS Brain: There is mild age-related atrophy and chronic microvascular ischemic changes. No acute intracranial hemorrhage. No mass effect or midline shift. No extra-axial  fluid collection. Vascular: No hyperdense vessel or unexpected calcification. Skull: Normal. Negative for fracture or focal lesion. Sinuses/Orbits: No acute finding. Other: Small left forehead laceration and hematoma. CT CERVICAL SPINE FINDINGS Alignment: No acute subluxation. Skull base and vertebrae: No acute fracture. No primary bone lesion or focal pathologic process. Soft tissues and spinal canal: No prevertebral fluid or swelling. No visible canal hematoma. Disc levels: Multilevel degenerative changes with endplate irregularity and disc space narrowing. Upper chest: Biapical pleural plaques and scarring. There are multiple subpleural nodular densities in the right upper lobe measuring up to 6 mm. There is a 3 mm ground-glass nodule in the left upper lobe (series 7, image 73). Please see CT of the chest for better evaluation of the lungs. Other: Loss of subcutaneous fat and cachexia. IMPRESSION: 1. No acute intracranial hemorrhage. 2. Age-related atrophy chronic  microvascular ischemic changes. 3. No acute/traumatic cervical spine pathology. Multilevel degenerative changes. 4. Areas of subpleural scarring versus nodule in the right upper lobe. Referred to the chest CT report for better evaluation of the lungs. Electronically Signed   By: Elgie CollardArash  Radparvar M.D.   On: 07/28/2017 04:05   Ct Abdomen Pelvis W Contrast  Result Date: 07/28/2017 CLINICAL DATA:  Found outside on brick patio, after climbing out of bedroom window. Leukocytosis. EXAM: CT CHEST, ABDOMEN, AND PELVIS WITH CONTRAST TECHNIQUE: Multidetector CT imaging of the chest, abdomen and pelvis was performed following the standard protocol during bolus administration of intravenous contrast. CONTRAST:  100 mL of Isovue 300 IV contrast COMPARISON:  Chest radiograph performed 03/26/2017 FINDINGS: CT CHEST FINDINGS Cardiovascular: The heart is normal in size. Scattered coronary artery calcifications are seen. Mild calcification is noted at the aortic arch. The great vessels are grossly unremarkable. Mediastinum/Nodes: A 1.3 cm subcarinal node is noted. No additional mediastinal lymphadenopathy is seen. No pericardial effusion is identified. The thyroid gland is unremarkable. No axillary lymphadenopathy is seen. Lungs/Pleura: Scattered bilateral airspace opacities are noted, most prominent at the right upper and lower lobes, concerning for multifocal pneumonia. No pleural effusion or pneumothorax is seen. No dominant mass is identified. Prominent calcified pleural plaques are noted at the right lung base. Musculoskeletal: No acute osseous abnormalities are identified. The visualized musculature is unremarkable in appearance. CT ABDOMEN PELVIS FINDINGS Hepatobiliary: The liver is unremarkable in appearance. The gallbladder is unremarkable in appearance. The common bile duct remains normal in caliber. Pancreas: The pancreas is within normal limits. Spleen: The spleen is unremarkable in appearance. Adrenals/Urinary Tract:  The adrenal glands are unremarkable in appearance. The kidneys are grossly unremarkable. There is no evidence of hydronephrosis. No renal or ureteral stones are identified. No perinephric stranding is appreciated. Stomach/Bowel: The stomach is unremarkable in appearance. The small bowel is within normal limits. The appendix is normal in caliber, without evidence of appendicitis. The colon is unremarkable in appearance. Vascular/Lymphatic: Scattered calcification is seen along the abdominal aorta and its branches. The abdominal aorta is otherwise grossly unremarkable. The inferior vena cava is grossly unremarkable. No retroperitoneal lymphadenopathy is seen. No pelvic sidewall lymphadenopathy is identified. Reproductive: The bladder is mildly distended and grossly unremarkable. The prostate is enlarged, measuring 5.3 cm in transverse dimension. Other: No additional soft tissue abnormalities are seen. Musculoskeletal: No acute osseous abnormalities are identified. The visualized musculature is unremarkable in appearance. IMPRESSION: 1. No evidence of traumatic injury to the chest, abdomen or pelvis. 2. Scattered bilateral airspace opacities, most prominent at the right upper and lower lobes, concerning for multifocal pneumonia. 3. 1.3 cm subcarinal node  may reflect the underlying infection. 4. Scattered coronary artery calcifications seen. 5.  Aortic Atherosclerosis (ICD10-I70.0). 6. Enlarged prostate noted. Electronically Signed   By: Roanna Raider M.D.   On: 07/28/2017 03:54    Radiology Ct Head Wo Contrast  Result Date: 07/28/2017 CLINICAL DATA:  83 year old male found on the floor with laceration above left eye. EXAM: CT HEAD WITHOUT CONTRAST CT CERVICAL SPINE WITHOUT CONTRAST TECHNIQUE: Multidetector CT imaging of the head and cervical spine was performed following the standard protocol without intravenous contrast. Multiplanar CT image reconstructions of the cervical spine were also generated.  COMPARISON:  Head CT dated 08/07/2016 FINDINGS: CT HEAD FINDINGS Brain: There is mild age-related atrophy and chronic microvascular ischemic changes. No acute intracranial hemorrhage. No mass effect or midline shift. No extra-axial fluid collection. Vascular: No hyperdense vessel or unexpected calcification. Skull: Normal. Negative for fracture or focal lesion. Sinuses/Orbits: No acute finding. Other: Small left forehead laceration and hematoma. CT CERVICAL SPINE FINDINGS Alignment: No acute subluxation. Skull base and vertebrae: No acute fracture. No primary bone lesion or focal pathologic process. Soft tissues and spinal canal: No prevertebral fluid or swelling. No visible canal hematoma. Disc levels: Multilevel degenerative changes with endplate irregularity and disc space narrowing. Upper chest: Biapical pleural plaques and scarring. There are multiple subpleural nodular densities in the right upper lobe measuring up to 6 mm. There is a 3 mm ground-glass nodule in the left upper lobe (series 7, image 73). Please see CT of the chest for better evaluation of the lungs. Other: Loss of subcutaneous fat and cachexia. IMPRESSION: 1. No acute intracranial hemorrhage. 2. Age-related atrophy chronic microvascular ischemic changes. 3. No acute/traumatic cervical spine pathology. Multilevel degenerative changes. 4. Areas of subpleural scarring versus nodule in the right upper lobe. Referred to the chest CT report for better evaluation of the lungs. Electronically Signed   By: Elgie Collard M.D.   On: 07/28/2017 04:05   Ct Chest W Contrast  Result Date: 07/28/2017 CLINICAL DATA:  Found outside on brick patio, after climbing out of bedroom window. Leukocytosis. EXAM: CT CHEST, ABDOMEN, AND PELVIS WITH CONTRAST TECHNIQUE: Multidetector CT imaging of the chest, abdomen and pelvis was performed following the standard protocol during bolus administration of intravenous contrast. CONTRAST:  100 mL of Isovue 300 IV  contrast COMPARISON:  Chest radiograph performed 03/26/2017 FINDINGS: CT CHEST FINDINGS Cardiovascular: The heart is normal in size. Scattered coronary artery calcifications are seen. Mild calcification is noted at the aortic arch. The great vessels are grossly unremarkable. Mediastinum/Nodes: A 1.3 cm subcarinal node is noted. No additional mediastinal lymphadenopathy is seen. No pericardial effusion is identified. The thyroid gland is unremarkable. No axillary lymphadenopathy is seen. Lungs/Pleura: Scattered bilateral airspace opacities are noted, most prominent at the right upper and lower lobes, concerning for multifocal pneumonia. No pleural effusion or pneumothorax is seen. No dominant mass is identified. Prominent calcified pleural plaques are noted at the right lung base. Musculoskeletal: No acute osseous abnormalities are identified. The visualized musculature is unremarkable in appearance. CT ABDOMEN PELVIS FINDINGS Hepatobiliary: The liver is unremarkable in appearance. The gallbladder is unremarkable in appearance. The common bile duct remains normal in caliber. Pancreas: The pancreas is within normal limits. Spleen: The spleen is unremarkable in appearance. Adrenals/Urinary Tract: The adrenal glands are unremarkable in appearance. The kidneys are grossly unremarkable. There is no evidence of hydronephrosis. No renal or ureteral stones are identified. No perinephric stranding is appreciated. Stomach/Bowel: The stomach is unremarkable in appearance. The small bowel is within  normal limits. The appendix is normal in caliber, without evidence of appendicitis. The colon is unremarkable in appearance. Vascular/Lymphatic: Scattered calcification is seen along the abdominal aorta and its branches. The abdominal aorta is otherwise grossly unremarkable. The inferior vena cava is grossly unremarkable. No retroperitoneal lymphadenopathy is seen. No pelvic sidewall lymphadenopathy is identified. Reproductive: The  bladder is mildly distended and grossly unremarkable. The prostate is enlarged, measuring 5.3 cm in transverse dimension. Other: No additional soft tissue abnormalities are seen. Musculoskeletal: No acute osseous abnormalities are identified. The visualized musculature is unremarkable in appearance. IMPRESSION: 1. No evidence of traumatic injury to the chest, abdomen or pelvis. 2. Scattered bilateral airspace opacities, most prominent at the right upper and lower lobes, concerning for multifocal pneumonia. 3. 1.3 cm subcarinal node may reflect the underlying infection. 4. Scattered coronary artery calcifications seen. 5.  Aortic Atherosclerosis (ICD10-I70.0). 6. Enlarged prostate noted. Electronically Signed   By: Roanna RaiderJeffery  Chang M.D.   On: 07/28/2017 03:54   Ct Cervical Spine Wo Contrast  Result Date: 07/28/2017 CLINICAL DATA:  83 year old male found on the floor with laceration above left eye. EXAM: CT HEAD WITHOUT CONTRAST CT CERVICAL SPINE WITHOUT CONTRAST TECHNIQUE: Multidetector CT imaging of the head and cervical spine was performed following the standard protocol without intravenous contrast. Multiplanar CT image reconstructions of the cervical spine were also generated. COMPARISON:  Head CT dated 08/07/2016 FINDINGS: CT HEAD FINDINGS Brain: There is mild age-related atrophy and chronic microvascular ischemic changes. No acute intracranial hemorrhage. No mass effect or midline shift. No extra-axial fluid collection. Vascular: No hyperdense vessel or unexpected calcification. Skull: Normal. Negative for fracture or focal lesion. Sinuses/Orbits: No acute finding. Other: Small left forehead laceration and hematoma. CT CERVICAL SPINE FINDINGS Alignment: No acute subluxation. Skull base and vertebrae: No acute fracture. No primary bone lesion or focal pathologic process. Soft tissues and spinal canal: No prevertebral fluid or swelling. No visible canal hematoma. Disc levels: Multilevel degenerative changes  with endplate irregularity and disc space narrowing. Upper chest: Biapical pleural plaques and scarring. There are multiple subpleural nodular densities in the right upper lobe measuring up to 6 mm. There is a 3 mm ground-glass nodule in the left upper lobe (series 7, image 73). Please see CT of the chest for better evaluation of the lungs. Other: Loss of subcutaneous fat and cachexia. IMPRESSION: 1. No acute intracranial hemorrhage. 2. Age-related atrophy chronic microvascular ischemic changes. 3. No acute/traumatic cervical spine pathology. Multilevel degenerative changes. 4. Areas of subpleural scarring versus nodule in the right upper lobe. Referred to the chest CT report for better evaluation of the lungs. Electronically Signed   By: Elgie CollardArash  Radparvar M.D.   On: 07/28/2017 04:05   Ct Abdomen Pelvis W Contrast  Result Date: 07/28/2017 CLINICAL DATA:  Found outside on brick patio, after climbing out of bedroom window. Leukocytosis. EXAM: CT CHEST, ABDOMEN, AND PELVIS WITH CONTRAST TECHNIQUE: Multidetector CT imaging of the chest, abdomen and pelvis was performed following the standard protocol during bolus administration of intravenous contrast. CONTRAST:  100 mL of Isovue 300 IV contrast COMPARISON:  Chest radiograph performed 03/26/2017 FINDINGS: CT CHEST FINDINGS Cardiovascular: The heart is normal in size. Scattered coronary artery calcifications are seen. Mild calcification is noted at the aortic arch. The great vessels are grossly unremarkable. Mediastinum/Nodes: A 1.3 cm subcarinal node is noted. No additional mediastinal lymphadenopathy is seen. No pericardial effusion is identified. The thyroid gland is unremarkable. No axillary lymphadenopathy is seen. Lungs/Pleura: Scattered bilateral airspace opacities are noted, most  prominent at the right upper and lower lobes, concerning for multifocal pneumonia. No pleural effusion or pneumothorax is seen. No dominant mass is identified. Prominent calcified  pleural plaques are noted at the right lung base. Musculoskeletal: No acute osseous abnormalities are identified. The visualized musculature is unremarkable in appearance. CT ABDOMEN PELVIS FINDINGS Hepatobiliary: The liver is unremarkable in appearance. The gallbladder is unremarkable in appearance. The common bile duct remains normal in caliber. Pancreas: The pancreas is within normal limits. Spleen: The spleen is unremarkable in appearance. Adrenals/Urinary Tract: The adrenal glands are unremarkable in appearance. The kidneys are grossly unremarkable. There is no evidence of hydronephrosis. No renal or ureteral stones are identified. No perinephric stranding is appreciated. Stomach/Bowel: The stomach is unremarkable in appearance. The small bowel is within normal limits. The appendix is normal in caliber, without evidence of appendicitis. The colon is unremarkable in appearance. Vascular/Lymphatic: Scattered calcification is seen along the abdominal aorta and its branches. The abdominal aorta is otherwise grossly unremarkable. The inferior vena cava is grossly unremarkable. No retroperitoneal lymphadenopathy is seen. No pelvic sidewall lymphadenopathy is identified. Reproductive: The bladder is mildly distended and grossly unremarkable. The prostate is enlarged, measuring 5.3 cm in transverse dimension. Other: No additional soft tissue abnormalities are seen. Musculoskeletal: No acute osseous abnormalities are identified. The visualized musculature is unremarkable in appearance. IMPRESSION: 1. No evidence of traumatic injury to the chest, abdomen or pelvis. 2. Scattered bilateral airspace opacities, most prominent at the right upper and lower lobes, concerning for multifocal pneumonia. 3. 1.3 cm subcarinal node may reflect the underlying infection. 4. Scattered coronary artery calcifications seen. 5.  Aortic Atherosclerosis (ICD10-I70.0). 6. Enlarged prostate noted. Electronically Signed   By: Roanna Raider M.D.    On: 07/28/2017 03:54    Procedures .Marland KitchenLaceration Repair Date/Time: 07/28/2017 5:45 AM Performed by: Cy Blamer, MD Authorized by: Cy Blamer, MD   Consent:    Consent obtained:  Verbal   Consent given by:  Patient   Risks discussed:  Infection   Alternatives discussed:  No treatment Anesthesia (see MAR for exact dosages):    Anesthesia method:  Topical application   Topical anesthetic:  LET Laceration details:    Location: forehead left above eyebrow.   Length (cm):  2   Depth (mm):  1 Repair type:    Repair type:  Simple Pre-procedure details:    Preparation:  Patient was prepped and draped in usual sterile fashion and imaging obtained to evaluate for foreign bodies Exploration:    Hemostasis achieved with:  LET   Wound exploration: wound explored through full range of motion     Wound extent: no areolar tissue violation noted     Contaminated: no   Treatment:    Area cleansed with:  Betadine and Hibiclens   Amount of cleaning:  Standard   Irrigation solution:  Sterile saline Skin repair:    Repair method:  Tissue adhesive Approximation:    Approximation:  Close Post-procedure details:    Dressing:  Sterile dressing   Patient tolerance of procedure:  Tolerated well, no immediate complications   (including critical care time)  Medications Ordered in ED Medications  Tdap (BOOSTRIX) injection 0.5 mL (0.5 mLs Intramuscular Given 07/28/17 0357)  lidocaine-EPINEPHrine-tetracaine (LET) solution (3 mLs Topical Given 07/28/17 0356)  iopamidol (ISOVUE-300) 61 % injection 100 mL (100 mLs Intravenous Contrast Given 07/28/17 0330)     Final Clinical Impressions(s) / ED Diagnoses   All questions answered to the patient's satisfaction.   Strict return precautions  for swelling or the lips or tongue, chest pain, dyspnea on exertion, new weakness or numbness changes in vision or speech, fevers, weakness persistent pain, Inability to tolerate liquids or food, changes in  voice cough, altered mental status or any concerns. No signs of systemic illness or infection. The patient is nontoxic-appearing on exam and vital signs are within normal limits.    I have reviewed the triage vital signs and the nursing notes. Pertinent labs &imaging results that were available during my care of the patient were reviewed by me and considered in my medical decision making (see chart for details).  After history, exam, and medical workup I feel the patient has been appropriately medically screened and is safe for discharge home. Pertinent diagnoses were discussed with the patient. Patient was given return precautions.       Nichoals Heyde, MD 07/28/17 [id number]

## 2017-07-28 NOTE — ED Notes (Signed)
In ct  

## 2017-07-28 NOTE — ED Triage Notes (Signed)
Pt brought in by EMS from home after he climbed out his bedroom window and fell about 5 feet landing on a brick patio  Pt was last seen at 9pm and family found him outside on the patio around 11pm  Pt has a laceration above his left eye  Wife put talcum powder in the laceration thinking it would stop the bleeding  Pt has dementia and Parkinsons disease  Pt has difficulty speaking due to his parkinsons   Pt is not on any blood thinners  Family states pt is acting per his norm  EMS reports room air sat was 90% so they applied oxygen at 3 liters/min via [place]    Towel roll in place per EMS for neck support

## 2017-08-14 DEATH — deceased

## 2018-06-17 IMAGING — CT CT HEAD W/O CM
3 series · 15 of 47 positions shown, 18 images · non-contrast
Comparison: None.

CLINICAL DATA: Parkinson's disease. Choked on food. Evaluate for
stroke causing dysphagia.

EXAM:
CT HEAD WITHOUT CONTRAST
TECHNIQUE: Contiguous axial images were obtained from the base of the skull
through the vertex without intravenous contrast.

[Series 2: head 5.0 h30s · axial · 0.44mm/px · z∈[-84,+56]mm · 9 of 34 slices shown, 12 images]
[im 3/34  brain]
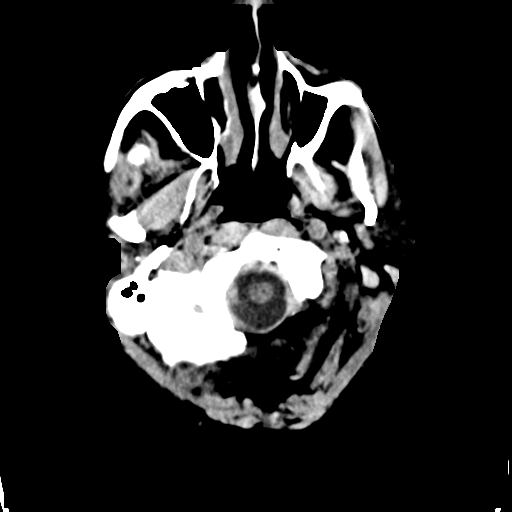
[im 3/34  bone]
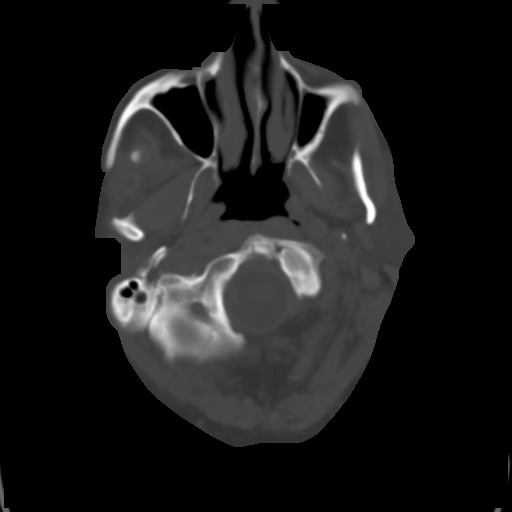
[im 6/34  brain]
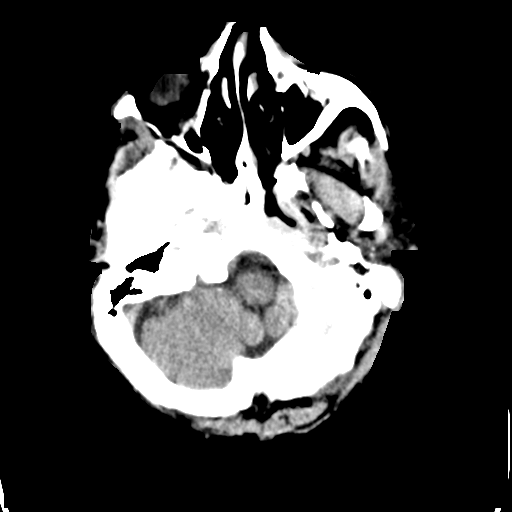
[im 10/34  brain]
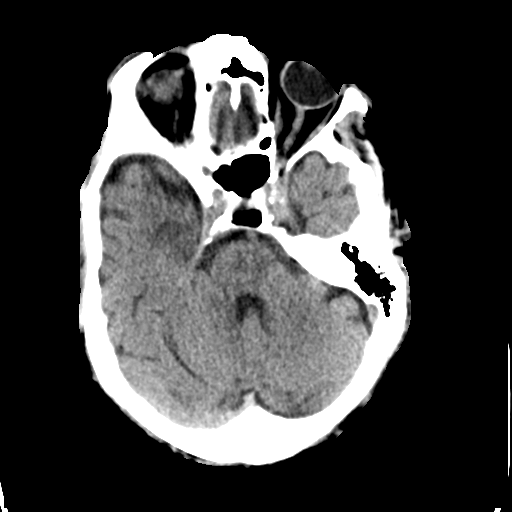
[im 13/34  brain]
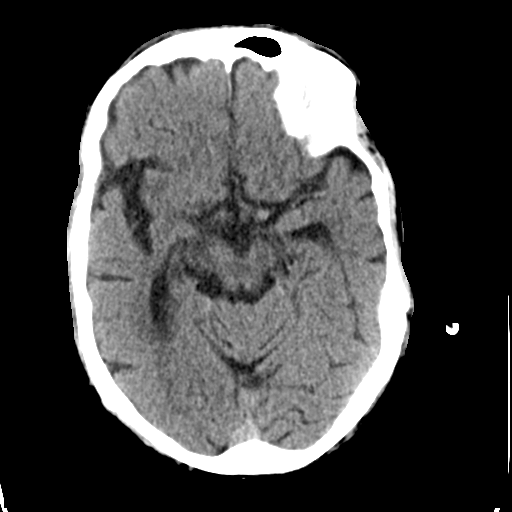
[im 18/34  brain]
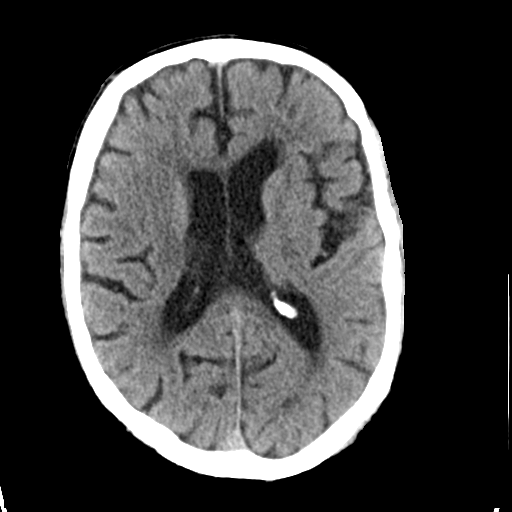
[im 18/34  bone]
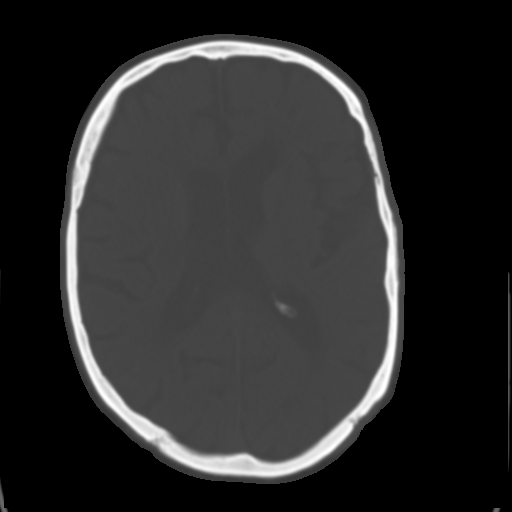
[im 21/34  brain]
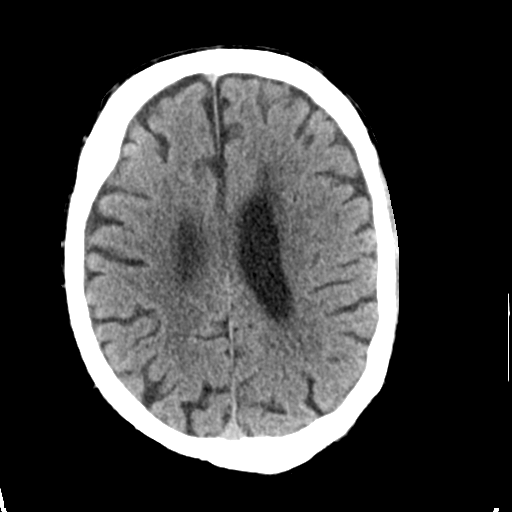
[im 24/34  brain]
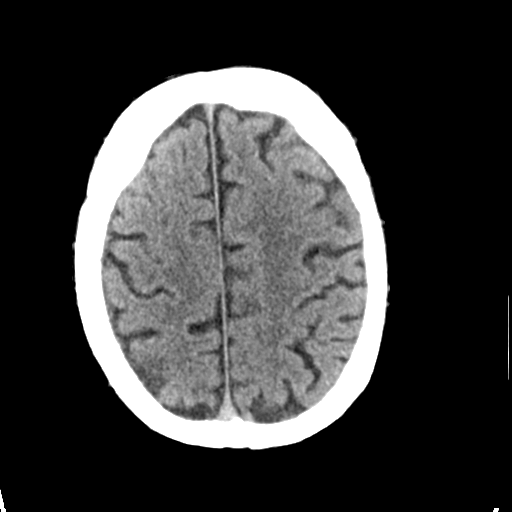
[im 28/34  brain]
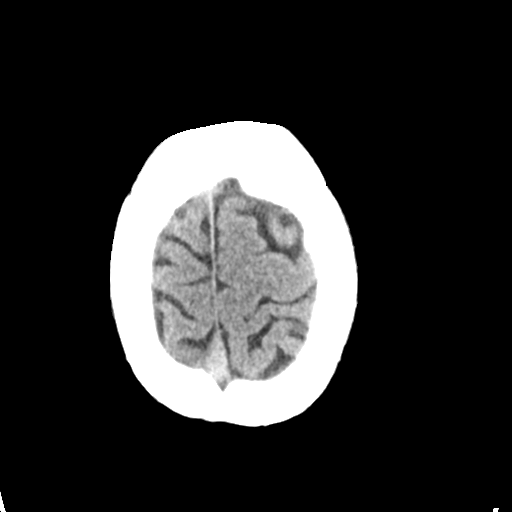
[im 31/34  brain]
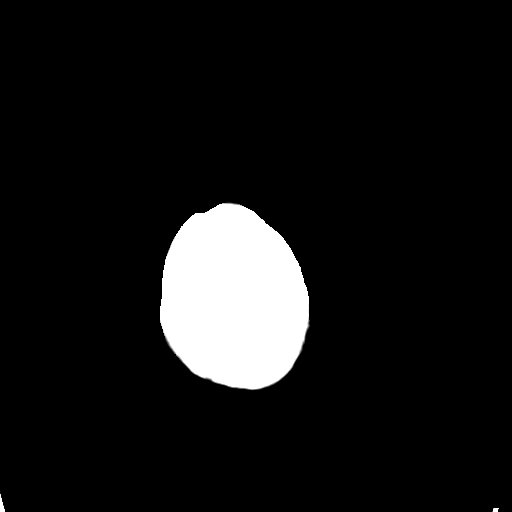
[im 31/34  bone]
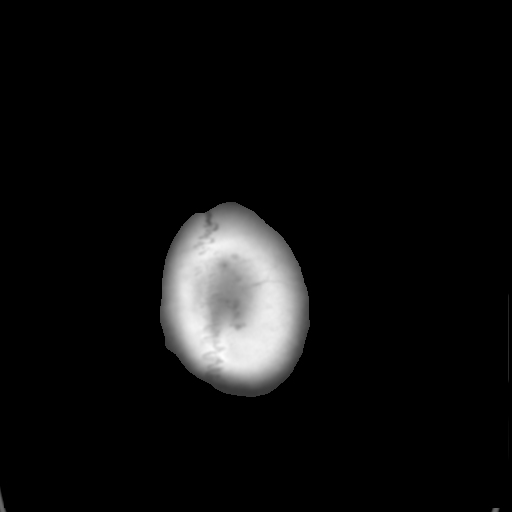

[Series 4: head 3.0 mpr cor · coronal · 0.33mm/px · 3 of 74 slices shown]
[im 25/74  brain]
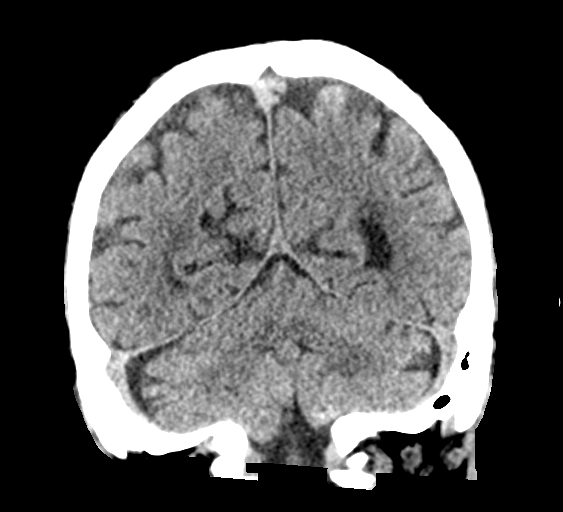
[im 33/74  brain]
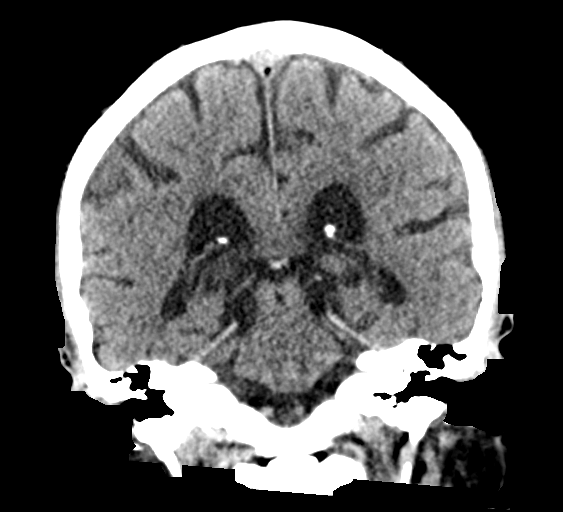
[im 41/74  brain]
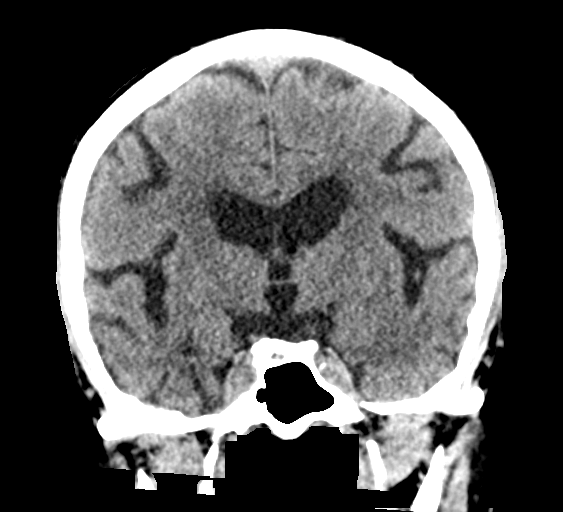

[Series 5: head 3.0 mpr sag · sagittal · 0.33mm/px · 3 of 60 slices shown]
[im 21/60  brain]
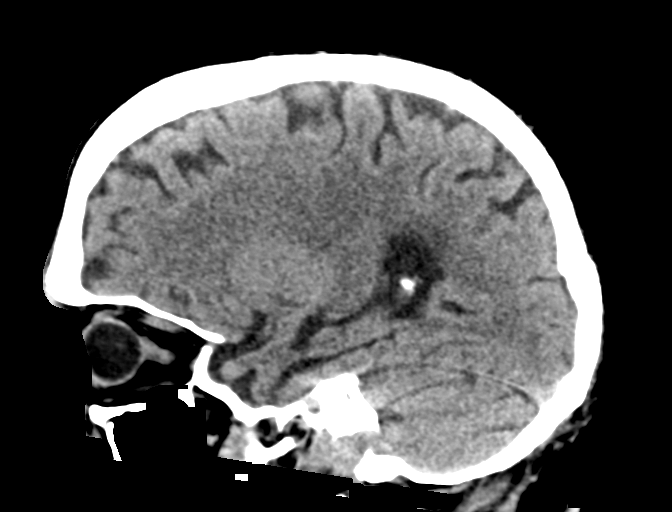
[im 30/60  brain]
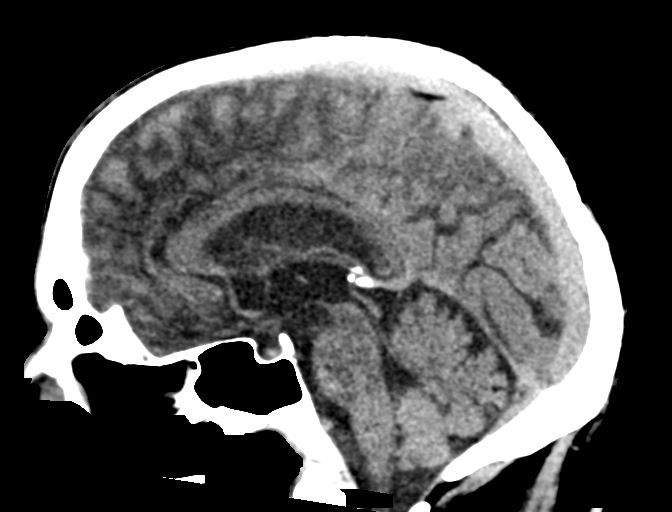
[im 40/60  brain]
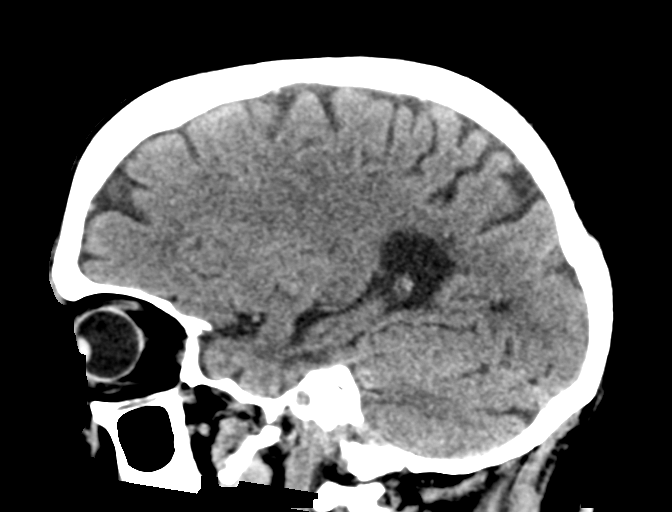

[15 of 47 positions shown; findings below may reference images not displayed]

FINDINGS: Brain: No evidence for acute hemorrhage, mass lesion, midline shift,
hydrocephalus or large infarct. There is mild age-appropriate
atrophy. There is subtle low-density in the periventricular white
matter suggesting chronic changes.

Vascular: No hyperdense vessel or unexpected calcification.

Skull: No fracture.

Sinuses/Orbits: Mild sinus disease in left maxillary sinus. Mastoid
air cells are clear.

Other: None.
IMPRESSION: No acute intracranial abnormality.

Evidence for chronic small vessel ischemic changes.
